# Patient Record
Sex: Male | Born: 1970
Health system: Southern US, Community
[De-identification: ages and names within clinical notes are randomized; demographics above are authoritative.]

## PROBLEM LIST (undated history)

## (undated) DIAGNOSIS — E785 Hyperlipidemia, unspecified: Secondary | ICD-10-CM

## (undated) DIAGNOSIS — I10 Essential (primary) hypertension: Secondary | ICD-10-CM

## (undated) HISTORY — PX: APPENDECTOMY: SHX54

## (undated) HISTORY — DX: Essential (primary) hypertension: I10

## (undated) HISTORY — DX: Hyperlipidemia, unspecified: E78.5

---

## 2000-10-04 ENCOUNTER — Inpatient Hospital Stay (HOSPITAL_COMMUNITY): Admission: EM | Admit: 2000-10-04 | Discharge: 2000-10-05 | Payer: Self-pay | Admitting: Emergency Medicine

## 2004-06-18 ENCOUNTER — Ambulatory Visit: Payer: Self-pay | Admitting: Pulmonary Disease

## 2010-07-21 ENCOUNTER — Telehealth: Payer: Self-pay | Admitting: Pulmonary Disease

## 2010-08-13 NOTE — Progress Notes (Signed)
Summary: Immunizations record request  Phone Note Call from Patient Call back at [phone]   Caller: Patient Call For: Kriste Basque Summary of Call: Pt requesting all immunization. Chart ordered . Zackery Barefoot CMA  July 21, 2010 10:58 AM   Follow-up for Phone Call        [facility] x 1. Per chart pt last seen 2005 and we do not show any documentation. We give PNA, Flu, and Tdap injection  Pt returned call from triage nurse. call [phone]. Tivis Ringer, CNA  July 21, 2010 4:18 PM  Additional Follow-up for Phone Call Additional follow up Details #1::        I advised pt of above. Pt states he will check with his HS for documentation of same. Is currently being followed by Dr. Izola Price in [place] "closer to home". 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 Zackery Barefoot CMA  July 21, 2010 4:24 PM

## 2011-10-05 ENCOUNTER — Encounter: Payer: Self-pay | Admitting: *Deleted

## 2011-10-05 ENCOUNTER — Encounter: Payer: 59 | Attending: Family Medicine | Admitting: *Deleted

## 2011-10-05 DIAGNOSIS — E785 Hyperlipidemia, unspecified: Secondary | ICD-10-CM | POA: Insufficient documentation

## 2011-10-05 DIAGNOSIS — Z713 Dietary counseling and surveillance: Secondary | ICD-10-CM | POA: Insufficient documentation

## 2011-10-05 DIAGNOSIS — I10 Essential (primary) hypertension: Secondary | ICD-10-CM | POA: Insufficient documentation

## 2011-10-05 NOTE — Patient Instructions (Signed)
Daily Goals:  1) 1500 mg or less of sodium  2) 200 mg or less of cholesterol 3) 25-30 g of fiber 4) Continue to limit alcohol, high fat foods, and concentrated sweets. 5) Continue current exercise regimen - Aim for 50 min most days.

## 2011-10-05 NOTE — Progress Notes (Signed)
Medical Nutrition Therapy:  Appt start time: 1000   End time:  1045.  Assessment:  Primary concerns today: Hyperlipidemia, HTN.  Pt here through [facility] program for HTN and high cholesterol.  Reports TC of 245 and increased TG.  Pt's BMI is 26 kg/m^2 and he appears fit and muscular. Issues are likely genetic as he has been on meds since age 41. Dietary intake WNL and pt does not consume sweets, sodas, etc.  ETOH intake is elevated; has 2 gl of wine on 2 days/week (recently decreased from 4 days) and ~6 beers over the weekend.  States he is continuing to decrease intake. Reports some meal skipping, usually breakfast.   MEDICATIONS: See medication list; reconciled with patient.   DIETARY INTAKE:  Usual eating pattern includes 2-3 meals and 0-1 snacks per day.  24-hr recall:  B ( AM): [place] sausage (1 pc), coffee; splenda & skim milk  Snk ( AM): none  L ( PM): [place] burger or grilled chicken salad at hospital Snk ( PM): None D ( PM): not reported Snk ( PM): not reported Beverages: Usually 2 glasses wine x4 days/week; 6 beers over weekend - has recently decreased to 2 glasses on weekend only + beers   Usual physical activity: Gym 4 days/week - 30-50 min cardio  Estimated energy needs: 1800 calories 225 g carbohydrates 110-115 g protein 50 g fat (<15 g as saturated fat)  Other Daily Recommendations: Sodium: 2000 mg or less Cholesterol: 200 mg or less Fiber: 25-30 g  Progress Towards Goal(s):  In progress.   Nutritional Diagnosis:  [place]-2.2 Altered nutrition-related laboratory related to elevated TG and cholesterol as evidenced by patient-reported family history of high cholesterol and a total cholesterol of 245 mg.    Intervention:  Nutrition education/reinforcement (see patient instructions).  Handouts given during visit include:  HTN/Chol Class handouts/ppt  [employer] Guide  Monitoring/Evaluation:  Dietary intake, exercise, labs (as available), and body  weight in 4 week(s).

## 2011-10-28 ENCOUNTER — Ambulatory Visit: Payer: 59 | Admitting: *Deleted

## 2011-11-10 ENCOUNTER — Ambulatory Visit: Payer: 59 | Admitting: *Deleted

## 2012-05-30 ENCOUNTER — Ambulatory Visit (HOSPITAL_COMMUNITY)
Admission: RE | Admit: 2012-05-30 | Discharge: 2012-05-30 | Disposition: A | Discharge status: Home / Self Care | Payer: 59 | Source: Ambulatory Visit | Attending: Emergency Medicine | Admitting: Emergency Medicine

## 2012-05-30 ENCOUNTER — Other Ambulatory Visit (HOSPITAL_COMMUNITY): Payer: Self-pay | Admitting: Emergency Medicine

## 2012-05-30 DIAGNOSIS — R05 Cough: Secondary | ICD-10-CM

## 2012-05-30 DIAGNOSIS — R059 Cough, unspecified: Secondary | ICD-10-CM

## 2012-08-26 ENCOUNTER — Other Ambulatory Visit: Payer: Self-pay

## 2013-05-17 ENCOUNTER — Other Ambulatory Visit: Payer: Self-pay

## 2013-11-24 ENCOUNTER — Emergency Department (HOSPITAL_COMMUNITY)
Admission: EM | Admit: 2013-11-24 | Discharge: 2013-11-25 | Disposition: A | Discharge status: Home / Self Care | Payer: 59 | Attending: Emergency Medicine | Admitting: Emergency Medicine

## 2013-11-24 ENCOUNTER — Encounter (HOSPITAL_COMMUNITY): Payer: Self-pay | Admitting: Emergency Medicine

## 2013-11-24 DIAGNOSIS — Z8639 Personal history of other endocrine, nutritional and metabolic disease: Secondary | ICD-10-CM | POA: Insufficient documentation

## 2013-11-24 DIAGNOSIS — J159 Unspecified bacterial pneumonia: Secondary | ICD-10-CM | POA: Insufficient documentation

## 2013-11-24 DIAGNOSIS — Z862 Personal history of diseases of the blood and blood-forming organs and certain disorders involving the immune mechanism: Secondary | ICD-10-CM | POA: Insufficient documentation

## 2013-11-24 DIAGNOSIS — Z79899 Other long term (current) drug therapy: Secondary | ICD-10-CM | POA: Insufficient documentation

## 2013-11-24 DIAGNOSIS — R0781 Pleurodynia: Secondary | ICD-10-CM

## 2013-11-24 DIAGNOSIS — J189 Pneumonia, unspecified organism: Secondary | ICD-10-CM

## 2013-11-24 DIAGNOSIS — I10 Essential (primary) hypertension: Secondary | ICD-10-CM | POA: Insufficient documentation

## 2013-11-24 DIAGNOSIS — R071 Chest pain on breathing: Secondary | ICD-10-CM | POA: Insufficient documentation

## 2013-11-24 NOTE — ED Notes (Signed)
Patient transported to XR. 

## 2013-11-24 NOTE — ED Provider Notes (Signed)
CSN: [id number]     Arrival date & time 11/24/13  2235 History   First MD Initiated Contact with Patient 11/24/13 2343     Chief Complaint  Patient presents with  . Chest Pain  . Back Pain  . Diarrhea     (Consider location/radiation/quality/duration/timing/severity/associated sxs/prior Treatment) HPI Patient is a 43 year old man with well-controlled hypertension. He presents with complaints of pleuritic pain which localizes to a specific region of the lateral left chest. Symptoms began about 48 hours ago. Symptoms are worse with certain movements of the torso and deep inspiration. The pain is sharp, mild to moderate in severity. The patient denies any associated shortness of breath, cough, fever. No history of trauma or strain.   No PE RF. No history of VTE. No history of similar sx. Pain is nonradiating.    Past Medical History  Diagnosis Date  . Hyperlipidemia   . Hypertension    Past Surgical History  Procedure Laterality Date  . Appendectomy     Family History  Problem Relation Age of Onset  . Diabetes Maternal Grandmother    History  Substance Use Topics  . Smoking status: Never Smoker   . Smokeless tobacco: Not on file  . Alcohol Use: Yes     Comment: 2 gl of wine x2 days/week + 5-6 beers over weekend    Review of Systems Ten point review of symptoms performed and is negative with the exception of symptoms noted above and endorsement of lose stools for the past month.     Allergies  Review of patient's allergies indicates no known allergies.  Home Medications   Prior to Admission medications   Medication Sig Start Date End Date Taking? Authorizing Provider  amLODipine-benazepril (LOTREL) 5-10 MG per capsule Take 1 capsule by mouth daily.    Historical Provider, MD  bisoprolol-hydrochlorothiazide [facility](ZIAC) 2.5-6.25 MG per tablet Take 1 tablet by mouth daily.    Historical Provider, MD  fish oil-omega-3 fatty acids 1000 MG capsule Take 2 g by mouth 2 (two) times  daily.    Historical Provider, MD   BP 168/86  Pulse 48  Temp(Src) 98.7 F (37.1 C) (Oral)  Resp 18  Ht 5\' 7"  (1.702 m)  Wt 174 lb (78.926 kg)  BMI 27.25 kg/m2  SpO2 98% Physical Exam Gen: well developed and well nourished appearing Head: NCAT Eyes: PERL, EOMI Nose: no epistaixis or rhinorrhea Mouth/throat: mucosa is moist and pink Neck: supple, no stridor Lungs: CTA B, no wheezing, rhonchi or rales Chest wall: no ttp CV: bradycardic in upper 40s and regular, no murmur, extremities appear well perfused.  Abd: soft, notender, nondistended Back: no ttp, no cva ttp Skin: warm and dry Ext: normal to inspection, no dependent edema Neuro: CN ii-xii grossly intact, no focal deficits Psyche; normal affect,  calm and cooperative.  ED Course  Procedures (including critical care time) Labs Review Results for orders placed during the hospital encounter of 11/24/13 (from the past 24 hour(s))  CBC     Status: None   Collection Time    11/24/13 11:45 PM      Result Value Ref Range   WBC 5.2  4.0 - 10.5 K/uL   RBC 4.63  4.22 - 5.81 MIL/uL   Hemoglobin 14.4  13.0 - 17.0 g/dL   HCT 40.940.0  81.139.0 - 91.452.0 %   MCV 86.4  78.0 - 100.0 fL   MCH 31.1  26.0 - 34.0 pg   MCHC 36.0  30.0 - 36.0 g/dL  RDW 12.0  11.5 - 15.5 %   Platelets 176  150 - 400 K/uL  COMPREHENSIVE METABOLIC PANEL     Status: Abnormal   Collection Time    11/24/13 11:45 PM      Result Value Ref Range   Sodium 138  137 - 147 mEq/L   Potassium 3.9  3.7 - 5.3 mEq/L   Chloride 102  96 - 112 mEq/L   CO2 23  19 - 32 mEq/L   Glucose, Bld 101 (*) 70 - 99 mg/dL   BUN 16  6 - 23 mg/dL   Creatinine, Ser 4.091.07  0.50 - 1.35 mg/dL   Calcium 9.7  8.4 - 81.110.5 mg/dL   Total Protein 7.7  6.0 - 8.3 g/dL   Albumin 4.3  3.5 - 5.2 g/dL   AST 23  0 - 37 U/L   ALT 19  0 - 53 U/L   Alkaline Phosphatase 40  39 - 117 U/L   Total Bilirubin 0.4  0.3 - 1.2 mg/dL   GFR calc non Af Amer 85 (*) >90 mL/min   GFR calc Af Amer >90  >90 mL/min  I-STAT  TROPOININ, ED     Status: None   Collection Time    11/25/13 12:00 AM      Result Value Ref Range   Troponin i, poc 0.00  0.00 - 0.08 ng/mL   Comment 3                DG Chest 2 View (Final result)  Result time: 11/25/13 00:30:37    Final result by Rad Results In Interface (11/25/13 00:30:37)    Narrative:   CLINICAL DATA: Mid back chest pain, history of diarrhea  EXAM: CHEST 2 VIEW  COMPARISON: None.  FINDINGS: The lungs are borderline hypoinflated. There are new coarse lung markings in the infrahilar region on the left posteriorly in the lower lobe consistent with pneumonia. The cardiopericardial silhouette is top-normal in size. The pulmonary vascularity is mildly prominent centrally. The trachea is midline. There is no pleural effusion or pneumothorax. The observed portions of the bony thorax appear normal.  IMPRESSION: Coarse lung markings in the left lower lobe are consistent with atelectasis or pneumonia. There is prominence of the cardiac silhouette and central pulmonary vascularity which is accentuated by borderline hypo- inflation. There is no definite evidence of pulmonary edema.   Electronically Signed By: David SwazilandJordan On: 11/25/2013 00:30    EKG: nsr, no acute ischemic changes, normal intervals, normal axis, normal qrs complex x for incomplete RBBB  MDM   DDX: pna, ptx, pe, acs, pleural effusion, myofascial pain.   Patient can be ruled out using PERC criteria. CXR concerning for possible LLL pneumonia v. Atelectasis. Although, the patient does not have symptoms or clinical findings to support pneumonia, his pain is concerning and myay represent early pna. Thus, will tx with Azithromycin for CAP. Patient to f/u with his PCP. Counseled re: results, plan, need for follow up and return preacautions.     Brandt LoosenJulie Breannah Kratt, MD 11/25/13 [id number]

## 2013-11-24 NOTE — ED Notes (Signed)
Pt c/o intermittent left back pain radiating to left axillary starting last weekend. Pt states pain increases when lying down, movement and deep inspiration. Pt denies any recent long distant travel. Onset of back pain when pt woke up. Pt denies any recent heavy lifting or moving. Pt also reports diarrhea for one month. Describes diarrhea as clear with mucus.

## 2013-11-25 ENCOUNTER — Emergency Department (HOSPITAL_COMMUNITY): Payer: 59

## 2013-11-25 LAB — COMPREHENSIVE METABOLIC PANEL
ALT: 19 U/L (ref 0–53)
AST: 23 U/L (ref 0–37)
Albumin: 4.3 g/dL (ref 3.5–5.2)
Alkaline Phosphatase: 40 U/L (ref 39–117)
BUN: 16 mg/dL (ref 6–23)
CALCIUM: 9.7 mg/dL (ref 8.4–10.5)
CO2: 23 mEq/L (ref 19–32)
Chloride: 102 mEq/L (ref 96–112)
Creatinine, Ser: 1.07 mg/dL (ref 0.50–1.35)
GFR calc Af Amer: >90 mL/min (ref >90)
GFR calc non Af Amer: 85 mL/min — ABNORMAL LOW (ref >90)
Glucose, Bld: 101 mg/dL — ABNORMAL HIGH (ref 70–99)
Potassium: 3.9 mEq/L (ref 3.7–5.3)
SODIUM: 138 meq/L (ref 137–147)
TOTAL PROTEIN: 7.7 g/dL (ref 6.0–8.3)
Total Bilirubin: 0.4 mg/dL (ref 0.3–1.2)

## 2013-11-25 LAB — I-STAT TROPONIN, ED: Troponin i, poc: 0 ng/mL (ref 0.00–0.08)

## 2013-11-25 LAB — CBC
HCT: 40 % (ref 39.0–52.0)
Hemoglobin: 14.4 g/dL (ref 13.0–17.0)
MCH: 31.1 pg (ref 26.0–34.0)
MCHC: 36 g/dL (ref 30.0–36.0)
MCV: 86.4 fL (ref 78.0–100.0)
Platelets: 176 10*3/uL (ref 150–400)
RBC: 4.63 MIL/uL (ref 4.22–5.81)
RDW: 12 % (ref 11.5–15.5)
WBC: 5.2 10*3/uL (ref 4.0–10.5)

## 2013-11-25 MED ORDER — TRAMADOL HCL 50 MG PO TABS: 50.0000 mg | ORAL_TABLET | Freq: Four times a day (QID) | ORAL | Status: DC | PRN

## 2013-11-25 MED ORDER — AZITHROMYCIN 250 MG PO TABS: 250.0000 mg | ORAL_TABLET | Freq: Every day | ORAL | Status: DC

## 2013-11-25 MED ORDER — TRAMADOL HCL 50 MG PO TABS
50.0000 mg | ORAL_TABLET | Freq: Once | ORAL | Status: AC
  Administered 2013-11-25: 50 mg via ORAL
  Filled 2013-11-25: qty 1

## 2013-11-25 MED ORDER — NAPROXEN 375 MG PO TABS: 375.0000 mg | ORAL_TABLET | Freq: Two times a day (BID) | ORAL | Status: DC

## 2013-11-25 NOTE — ED Notes (Signed)
Pt pain a 7/10. mischarted 3/10. MD notified. Orders obtained

## 2013-11-25 NOTE — ED Notes (Signed)
Patient taken to XR.

## 2014-12-25 ENCOUNTER — Telehealth: Payer: Self-pay | Admitting: Family

## 2014-12-25 DIAGNOSIS — J019 Acute sinusitis, unspecified: Secondary | ICD-10-CM

## 2014-12-25 MED ORDER — AMOXICILLIN-POT CLAVULANATE 875-125 MG PO TABS: 1.0000 | ORAL_TABLET | Freq: Two times a day (BID) | ORAL | Status: DC

## 2014-12-25 NOTE — Progress Notes (Signed)

## 2015-09-02 DIAGNOSIS — H7411 Adhesive right middle ear disease: Secondary | ICD-10-CM | POA: Diagnosis not present

## 2015-09-02 DIAGNOSIS — H9011 Conductive hearing loss, unilateral, right ear, with unrestricted hearing on the contralateral side: Secondary | ICD-10-CM | POA: Diagnosis not present

## 2015-09-18 MED FILL — BISOPROLOL-HCTZ 10-6.25 MG: 10-6.25 | 90 days supply | Qty: 90 | Fill #0

## 2015-09-18 MED FILL — AMLODIPINE-BENAZEPRIL 10-20: 10-20 | 90 days supply | Qty: 90 | Fill #0

## 2015-12-18 MED FILL — AMLODIPINE-BENAZEPRIL 10-20: 10-20 | 90 days supply | Qty: 90 | Fill #0

## 2015-12-18 MED FILL — BISOPROLOL-HCTZ 10-6.25 MG: 10-6.25 | 90 days supply | Qty: 90 | Fill #0

## 2016-01-05 DIAGNOSIS — E7800 Pure hypercholesterolemia, unspecified: Secondary | ICD-10-CM | POA: Diagnosis not present

## 2016-01-05 DIAGNOSIS — I10 Essential (primary) hypertension: Secondary | ICD-10-CM | POA: Diagnosis not present

## 2016-03-19 MED FILL — BISOPROLOL-HCTZ 10-6.25 MG: 10-6.25 | 90 days supply | Qty: 90 | Fill #0

## 2016-03-19 MED FILL — AMLODIPINE-BENAZEPRIL 10-20: 10-20 | 90 days supply | Qty: 90 | Fill #0

## 2016-05-13 DIAGNOSIS — H5213 Myopia, bilateral: Secondary | ICD-10-CM | POA: Diagnosis not present

## 2016-06-16 MED FILL — AMLODIPINE-BENAZEPRIL 10-20: 10-20 | 90 days supply | Qty: 90 | Fill #1

## 2016-06-16 MED FILL — BISOPROLOL-HCTZ 10-6.25 MG: 10-6.25 | 90 days supply | Qty: 90 | Fill #1

## 2016-08-11 DIAGNOSIS — E7800 Pure hypercholesterolemia, unspecified: Secondary | ICD-10-CM | POA: Diagnosis not present

## 2016-08-11 DIAGNOSIS — I10 Essential (primary) hypertension: Secondary | ICD-10-CM | POA: Diagnosis not present

## 2016-08-18 DIAGNOSIS — H6041 Cholesteatoma of right external ear: Secondary | ICD-10-CM | POA: Diagnosis not present

## 2016-08-18 DIAGNOSIS — H73091 Other acute myringitis, right ear: Secondary | ICD-10-CM | POA: Diagnosis not present

## 2016-08-18 DIAGNOSIS — H9011 Conductive hearing loss, unilateral, right ear, with unrestricted hearing on the contralateral side: Secondary | ICD-10-CM | POA: Diagnosis not present

## 2016-08-18 DIAGNOSIS — H7411 Adhesive right middle ear disease: Secondary | ICD-10-CM | POA: Diagnosis not present

## 2016-09-03 MED FILL — LOVASTATIN 20 MG TABLET: 20 | 90 days supply | Qty: 90 | Fill #0

## 2016-09-13 DIAGNOSIS — H04331 Acute lacrimal canaliculitis of right lacrimal passage: Secondary | ICD-10-CM | POA: Diagnosis not present

## 2016-09-14 MED FILL — BISOPROLOL-HCTZ 10-6.25 MG: 10-6.25 | 90 days supply | Qty: 90 | Fill #0

## 2016-09-14 MED FILL — AMLODIPINE-BENAZEPRIL 10-20: 10-20 | 90 days supply | Qty: 90 | Fill #0

## 2016-10-21 DIAGNOSIS — H9011 Conductive hearing loss, unilateral, right ear, with unrestricted hearing on the contralateral side: Secondary | ICD-10-CM | POA: Diagnosis not present

## 2016-10-21 DIAGNOSIS — H7411 Adhesive right middle ear disease: Secondary | ICD-10-CM | POA: Diagnosis not present

## 2016-12-16 MED FILL — AMLODIPINE-BENAZEPRIL 10-20: 10-20 | 90 days supply | Qty: 90 | Fill #1

## 2016-12-16 MED FILL — BISOPROLOL-HCTZ 10-6.25 MG: 10-6.25 | 90 days supply | Qty: 90 | Fill #1

## 2017-02-09 DIAGNOSIS — H9011 Conductive hearing loss, unilateral, right ear, with unrestricted hearing on the contralateral side: Secondary | ICD-10-CM | POA: Diagnosis not present

## 2017-02-09 DIAGNOSIS — H6981 Other specified disorders of Eustachian tube, right ear: Secondary | ICD-10-CM | POA: Diagnosis not present

## 2017-02-09 DIAGNOSIS — H7411 Adhesive right middle ear disease: Secondary | ICD-10-CM | POA: Diagnosis not present

## 2017-03-15 MED FILL — BISOPROLOL-HCTZ 10-6.25 MG: 10-6.25 | 90 days supply | Qty: 90 | Fill #0

## 2017-03-15 MED FILL — AMLODIPINE-BENAZEPRIL 10-20: 10-20 | 90 days supply | Qty: 90 | Fill #0

## 2017-05-28 ENCOUNTER — Telehealth: Payer: 59 | Admitting: Family

## 2017-05-28 DIAGNOSIS — J029 Acute pharyngitis, unspecified: Secondary | ICD-10-CM

## 2017-05-28 MED ORDER — FLUTICASONE PROPIONATE 50 MCG/ACT NA SUSP: 2.0000 | Freq: Every day | NASAL | 6 refills | Status: DC

## 2017-05-28 MED ORDER — PREDNISONE 5 MG PO TABS: 5.0000 mg | ORAL_TABLET | ORAL | 0 refills | Status: DC

## 2017-05-28 MED ORDER — BENZONATATE 100 MG PO CAPS: 100.0000 mg | ORAL_CAPSULE | Freq: Three times a day (TID) | ORAL | 0 refills | Status: DC | PRN

## 2017-05-28 NOTE — Progress Notes (Signed)
Thank you for the details you included in the comment boxes. Those details are very helpful in determining the best course of treatment for you and help us to provide the best care. You will see the treatment plan covers cough also. I am sending flonase prescription in addition to the treatment below. This is likely a virus at this point in time as 89% of these infections are viral.   We are sorry that you are not feeling well.  Here is how we plan to help!  Based on your presentation I believe you most likely have A cough due to a virus.  This is called viral bronchitis and is best treated by rest, plenty of fluids and control of the cough.  You may use Ibuprofen or Tylenol as directed to help your symptoms.     In addition you may use A non-prescription cough medication called Mucinex DM: take 2 tablets every 12 hours. and A prescription cough medication called Tessalon Perles 100mg . You may take 1-2 capsules every 8 hours as needed for your cough.  Sterapred 5 mg dosepak  From your responses in the eVisit questionnaire you describe inflammation in the upper respiratory tract which is causing a significant cough.  This is commonly called Bronchitis and has four common causes:    Allergies  Viral Infections  Acid Reflux  Bacterial Infection Allergies, viruses and acid reflux are treated by controlling symptoms or eliminating the cause. An example might be a cough caused by taking certain blood pressure medications. You stop the cough by changing the medication. Another example might be a cough caused by acid reflux. Controlling the reflux helps control the cough.  USE OF BRONCHODILATOR ("RESCUE") INHALERS: There is a risk from using your bronchodilator too frequently.  The risk is that over-reliance on a medication which only relaxes the muscles surrounding the breathing tubes can reduce the effectiveness of medications prescribed to reduce swelling and congestion of the tubes themselves.   Although you feel brief relief from the bronchodilator inhaler, your asthma may actually be worsening with the tubes becoming more swollen and filled with mucus.  This can delay other crucial treatments, such as oral steroid medications. If you need to use a bronchodilator inhaler daily, several times per day, you should discuss this with your provider.  There are probably better treatments that could be used to keep your asthma under control.     HOME CARE . Only take medications as instructed by your medical team. . Complete the entire course of an antibiotic. . Drink plenty of fluids and get plenty of rest. . Avoid close contacts especially the very young and the elderly . Cover your mouth if you cough or cough into your sleeve. . Always remember to wash your hands . A steam or ultrasonic humidifier can help congestion.   GET HELP RIGHT AWAY IF: . You develop worsening fever. . You become short of breath . You cough up blood. . Your symptoms persist after you have completed your treatment plan MAKE SURE YOU   Understand these instructions.  Will watch your condition.  Will get help right away if you are not doing well or get worse.  Your e-visit answers were reviewed by a board certified advanced clinical practitioner to complete your personal care plan.  Depending on the condition, your plan could have included both over the counter or prescription medications. If there is a problem please reply  once you have received a response from your provider. Your  safety is important to us.  If you have drug allergies check your prescription carefully.    You can use MyChart to ask questions about today's visit, request a non-urgent call back, or ask for a work or school excuse for 24 hours related to this e-Visit. If it has been greater than 24 hours you will need to follow up with your provider, or enter a new e-Visit to address those concerns. You will get an e-mail in the next two days  asking about your experience.  I hope that your e-visit has been valuable and will speed your recovery. Thank you for using e-visits.

## 2017-05-30 MED FILL — FLUTICASONE PROP 50 MCG SPR: 50 | 30 days supply | Qty: 16 | Fill #0

## 2017-05-30 MED FILL — BENZONATATE 100 MG CAPS: 100 | 5 days supply | Qty: 30 | Fill #0

## 2017-05-30 MED FILL — predniSONE 5 MG TABS: 5 | 6 days supply | Qty: 21 | Fill #0

## 2017-06-22 MED FILL — AMLODIPINE-BENAZEPRIL 10-20: 10-20 | 90 days supply | Qty: 90 | Fill #0

## 2017-06-23 MED FILL — BISOPROLOL-HCTZ 10-6.25 MG: 10-6.25 | 90 days supply | Qty: 90 | Fill #0

## 2017-09-05 ENCOUNTER — Telehealth: Payer: 59 | Admitting: Family

## 2017-09-05 DIAGNOSIS — R6889 Other general symptoms and signs: Secondary | ICD-10-CM

## 2017-09-05 MED ORDER — OSELTAMIVIR PHOSPHATE 75 MG PO CAPS: 75.0000 mg | ORAL_CAPSULE | Freq: Two times a day (BID) | ORAL | 0 refills | Status: DC

## 2017-09-05 MED FILL — OSELTAMIVIR PHOSPHATE 75 MG: 75 | 5 days supply | Qty: 10 | Fill #0

## 2017-09-05 NOTE — Progress Notes (Signed)
Thank you for the details you included in the comment boxes. Those details are very helpful in determining the best course of treatment for you and help [place] to provide the best care. As you work in [employer], your chance of exposure to flu is higher than the general population. Therefore, I will prescribe the Tamiflu with the instruction that if you worsen (body aches, malaise, high fever, etc.) within the next 24h, you can take the Tamiflu. At this point, this may just be a regular virus. See below.  E visit for Flu like symptoms   We are sorry that you are not feeling well.  Here is how we plan to help! Based on what you have shared with me it looks like you may have possible exposure to a virus that causes influenza.  Influenza or "the flu" is   an infection caused by a respiratory virus. The flu virus is highly contagious and persons who did not receive their yearly flu vaccination may "catch" the flu from close contact.  We have anti-viral medications to treat the viruses that cause this infection. They are not a "cure" and only shorten the course of the infection. These prescriptions are most effective when they are given within the first 2 days of "flu" symptoms. Antiviral medication are indicated if you have a high risk of complications from the flu. You should  also consider an antiviral medication if you are in close contact with someone who is at risk. These medications can help patients avoid complications from the flu  but have side effects that you should know. Possible side effects from Tamiflu or oseltamivir include nausea, vomiting, diarrhea, dizziness, headaches, eye redness, sleep problems or other respiratory symptoms. You should not take Tamiflu if you have an allergy to oseltamivir or any to the ingredients in Tamiflu.  Based upon your symptoms and potential risk factors I have prescribed Oseltamivir (Tamiflu).  It has been sent to your designated pharmacy.  You will take one 75 mg  capsule orally twice a day for the next 5 days.  ANYONE WHO HAS FLU SYMPTOMS SHOULD: . Stay home. The flu is highly contagious and going out or to work exposes others! . Be sure to drink plenty of fluids. Water is fine as well as fruit juices, sodas and electrolyte beverages. You may want to stay away from caffeine or alcohol. If you are nauseated, try taking small sips of liquids. How do you know if you are getting enough fluid? Your urine should be a pale yellow or almost colorless. . Get rest. . Taking a steamy shower or using a humidifier may help nasal congestion and ease sore throat pain. Using a saline nasal spray works much the same way. . Cough drops, hard candies and sore throat lozenges may ease your cough. . Line up a caregiver. Have someone check on you regularly.   GET HELP RIGHT AWAY IF: . You cannot keep down liquids or your medications. . You become short of breath . Your fell like you are going to pass out or loose consciousness. . Your symptoms persist after you have completed your treatment plan MAKE SURE YOU   Understand these instructions.  Will watch your condition.  Will get help right away if you are not doing well or get worse.  Your e-visit answers were reviewed by a board certified advanced clinical practitioner to complete your personal care plan.  Depending on the condition, your plan could have included both over the counter  or prescription medications.  If there is a problem please reply  once you have received a response from your provider.  Your safety is important to us.  If you have drug allergies check your prescription carefully.    You can use MyChart to ask questions about today's visit, request a non-urgent call back, or ask for a work or school excuse for 24 hours related to this e-Visit. If it has been greater than 24 hours you will need to follow up with your provider, or enter a new e-Visit to address those concerns.  You will get an e-mail  in the next two days asking about your experience.  I hope that your e-visit has been valuable and will speed your recovery. Thank you for using e-visits.

## 2017-09-19 MED FILL — BISOPROLOL-HCTZ 10-6.25 MG: 10-6.25 | 90 days supply | Qty: 90 | Fill #0

## 2017-09-19 MED FILL — AMLODIPINE-BENAZEPRIL 10-20: 10-20 | 90 days supply | Qty: 90 | Fill #0

## 2017-10-04 DIAGNOSIS — H6981 Other specified disorders of Eustachian tube, right ear: Secondary | ICD-10-CM | POA: Diagnosis not present

## 2017-10-04 DIAGNOSIS — H7411 Adhesive right middle ear disease: Secondary | ICD-10-CM | POA: Diagnosis not present

## 2017-10-04 DIAGNOSIS — H6041 Cholesteatoma of right external ear: Secondary | ICD-10-CM | POA: Diagnosis not present

## 2017-10-04 DIAGNOSIS — H9011 Conductive hearing loss, unilateral, right ear, with unrestricted hearing on the contralateral side: Secondary | ICD-10-CM | POA: Diagnosis not present

## 2017-11-16 DIAGNOSIS — H5213 Myopia, bilateral: Secondary | ICD-10-CM | POA: Diagnosis not present

## 2017-11-25 DIAGNOSIS — E7800 Pure hypercholesterolemia, unspecified: Secondary | ICD-10-CM | POA: Diagnosis not present

## 2017-11-25 DIAGNOSIS — Z23 Encounter for immunization: Secondary | ICD-10-CM | POA: Diagnosis not present

## 2017-11-25 DIAGNOSIS — Z0000 Encounter for general adult medical examination without abnormal findings: Secondary | ICD-10-CM | POA: Diagnosis not present

## 2017-11-25 DIAGNOSIS — I10 Essential (primary) hypertension: Secondary | ICD-10-CM | POA: Diagnosis not present

## 2017-12-19 MED FILL — AMLODIPINE-BENAZEPRIL 10-20: 10-20 | 90 days supply | Qty: 90 | Fill #0

## 2017-12-19 MED FILL — BISOPROLOL-HCTZ 10-6.25 MG: 10-6.25 | 90 days supply | Qty: 90 | Fill #0

## 2017-12-19 MED FILL — LOVASTATIN 20 MG TABS: 20 | 90 days supply | Qty: 90 | Fill #0

## 2018-03-23 MED FILL — AMLODIPINE-BENAZEPRIL 10-20: 10-20 | 90 days supply | Qty: 90 | Fill #1

## 2018-03-23 MED FILL — BISOPROLOL-HCTZ 10-6.25 MG: 10-6.25 | 90 days supply | Qty: 90 | Fill #1

## 2018-06-26 MED FILL — BISOPROLOL-HCTZ 10-6.25 MG: 10-6.25 | 90 days supply | Qty: 90 | Fill #0

## 2018-06-26 MED FILL — AMLODIPINE-BENAZEPRIL 10-20: 10-20 | 90 days supply | Qty: 90 | Fill #0

## 2018-09-13 MED FILL — BISOPROLOL-HCTZ 10-6.25 MG: 10-6.25 | 90 days supply | Qty: 90 | Fill #0

## 2018-09-15 MED FILL — AMLODIPINE-BENAZEPRIL 10-20: 10-20 | 90 days supply | Qty: 90 | Fill #0

## 2018-09-26 DIAGNOSIS — H9011 Conductive hearing loss, unilateral, right ear, with unrestricted hearing on the contralateral side: Secondary | ICD-10-CM | POA: Diagnosis not present

## 2018-09-26 DIAGNOSIS — H6041 Cholesteatoma of right external ear: Secondary | ICD-10-CM | POA: Diagnosis not present

## 2018-09-26 DIAGNOSIS — H7411 Adhesive right middle ear disease: Secondary | ICD-10-CM | POA: Diagnosis not present

## 2018-09-26 DIAGNOSIS — H6981 Other specified disorders of Eustachian tube, right ear: Secondary | ICD-10-CM | POA: Diagnosis not present

## 2018-09-26 DIAGNOSIS — H7311 Chronic myringitis, right ear: Secondary | ICD-10-CM | POA: Diagnosis not present

## 2018-10-10 DIAGNOSIS — R361 Hematospermia: Secondary | ICD-10-CM | POA: Diagnosis not present

## 2018-10-10 DIAGNOSIS — Z87438 Personal history of other diseases of male genital organs: Secondary | ICD-10-CM | POA: Diagnosis not present

## 2018-10-10 MED FILL — SULFAMETHOXAZOLE-TMP DS TAB: 800-160 | 21 days supply | Qty: 42 | Fill #0

## 2018-11-15 ENCOUNTER — Telehealth: Payer: 59 | Admitting: Nurse Practitioner

## 2018-11-15 DIAGNOSIS — H1010 Acute atopic conjunctivitis, unspecified eye: Secondary | ICD-10-CM

## 2018-11-15 NOTE — Progress Notes (Signed)
We are sorry that you are not feeling well.  Here is how we plan to help!  Based on what you have shared with me it looks like you have conjunctivitis.  Conjunctivitis is a common inflammatory or infectious condition of the eye that is often referred to as "pink eye".  In most cases it is contagious (viral or bacterial). However, not all conjunctivitis requires antibiotics (ex. Allergic).  We have made appropriate suggestions for you based upon your presentation.  I recommend that you use OpconA, 1-2 drops every 4-6 hours (an over the counter allergy drop available at your local pharmacy).  Your pharmacist may have an alternative suggestion.  Pink eye can be highly contagious.  It is typically spread through direct contact with secretions, or contaminated objects or surfaces that one may have touched.  Strict handwashing is suggested with soap and water is urged.  If not available, use alcohol based had sanitizer.  Avoid unnecessary touching of the eye.  If you wear contact lenses, you will need to refrain from wearing them until you see no white discharge from the eye for at least 24 hours after being on medication.  You should see symptom improvement in 1-2 days after starting the medication regimen.  Call us if symptoms are not improved in 1-2 days.  Home Care:  Wash your hands often!  Do not wear your contacts until you complete your treatment plan.  Avoid sharing towels, bed linen, personal items with a person who has pink eye.  See attention for anyone in your home with similar symptoms.  Get Help Right Away If:  Your symptoms do not improve.  You develop blurred or loss of vision.  Your symptoms worsen (increased discharge, pain or redness)  Your e-visit answers were reviewed by a board certified advanced clinical practitioner to complete your personal care plan.  Depending on the condition, your plan could have included both over the counter or prescription medications.  If there  is a problem please reply  once you have received a response from your provider.  Your safety is important to [place].  If you have drug allergies check your prescription carefully.    You can use MyChart to ask questions about today's visit, request a non-urgent call back, or ask for a work or school excuse for 24 hours related to this e-Visit. If it has been greater than 24 hours you will need to follow up with your provider, or enter a new e-Visit to address those concerns.   You will get an e-mail in the next two days asking about your experience.  I hope that your e-visit has been valuable and will speed your recovery. Thank you for using e-visits.   5-10 minutes spent reviewing and documenting in chart.

## 2018-12-13 MED FILL — BISOPROLOL-HCTZ 10-6.25 MG: 10-6.25 | 90 days supply | Qty: 90 | Fill #0

## 2018-12-13 MED FILL — AMLODIPINE-BENAZEPRIL 10-20: 10-20 | 90 days supply | Qty: 90 | Fill #0

## 2019-02-14 MED FILL — AMLODIPINE-BENAZ 10-40 MG: 10-40 | 30 days supply | Qty: 30 | Fill #0

## 2019-03-13 ENCOUNTER — Other Ambulatory Visit: Payer: Self-pay

## 2019-03-13 ENCOUNTER — Emergency Department (HOSPITAL_BASED_OUTPATIENT_CLINIC_OR_DEPARTMENT_OTHER)
Admission: EM | Admit: 2019-03-13 | Discharge: 2019-03-13 | Disposition: A | Discharge status: Home / Self Care | Payer: 59 | Attending: Emergency Medicine | Admitting: Emergency Medicine

## 2019-03-13 ENCOUNTER — Encounter (HOSPITAL_BASED_OUTPATIENT_CLINIC_OR_DEPARTMENT_OTHER): Payer: Self-pay | Admitting: Emergency Medicine

## 2019-03-13 ENCOUNTER — Emergency Department (HOSPITAL_BASED_OUTPATIENT_CLINIC_OR_DEPARTMENT_OTHER): Payer: 59

## 2019-03-13 DIAGNOSIS — R0789 Other chest pain: Secondary | ICD-10-CM | POA: Diagnosis not present

## 2019-03-13 DIAGNOSIS — E119 Type 2 diabetes mellitus without complications: Secondary | ICD-10-CM | POA: Diagnosis not present

## 2019-03-13 DIAGNOSIS — R079 Chest pain, unspecified: Secondary | ICD-10-CM | POA: Diagnosis not present

## 2019-03-13 DIAGNOSIS — Z79899 Other long term (current) drug therapy: Secondary | ICD-10-CM | POA: Diagnosis not present

## 2019-03-13 DIAGNOSIS — E785 Hyperlipidemia, unspecified: Secondary | ICD-10-CM | POA: Diagnosis not present

## 2019-03-13 DIAGNOSIS — I10 Essential (primary) hypertension: Secondary | ICD-10-CM | POA: Diagnosis not present

## 2019-03-13 LAB — BASIC METABOLIC PANEL
Anion gap: 11 (ref 5–15)
BUN: 13 mg/dL (ref 6–20)
CO2: 25 mmol/L (ref 22–32)
Calcium: 10.2 mg/dL (ref 8.9–10.3)
Chloride: 100 mmol/L (ref 98–111)
Creatinine, Ser: 1.1 mg/dL (ref 0.61–1.24)
GFR calc Af Amer: >60 mL/min (ref >60)
GFR calc non Af Amer: >60 mL/min (ref >60)
Glucose, Bld: 110 mg/dL — ABNORMAL HIGH (ref 70–99)
Potassium: 3.5 mmol/L (ref 3.5–5.1)
Sodium: 136 mmol/L (ref 135–145)

## 2019-03-13 LAB — CBC
HCT: 42.1 % (ref 39.0–52.0)
Hemoglobin: 14.5 g/dL (ref 13.0–17.0)
MCH: 30.7 pg (ref 26.0–34.0)
MCHC: 34.4 g/dL (ref 30.0–36.0)
MCV: 89 fL (ref 80.0–100.0)
Platelets: 192 10*3/uL (ref 150–400)
RBC: 4.73 MIL/uL (ref 4.22–5.81)
RDW: 12.6 % (ref 11.5–15.5)
WBC: 4.7 10*3/uL (ref 4.0–10.5)
nRBC: 0 % (ref 0.0–0.2)

## 2019-03-13 LAB — TROPONIN I (HIGH SENSITIVITY): Troponin I (High Sensitivity): 5 ng/L (ref <18)

## 2019-03-13 NOTE — ED Notes (Signed)
ED Provider at bedside. 

## 2019-03-13 NOTE — ED Provider Notes (Signed)
[facility] EMERGENCY DEPARTMENT Provider Note   CSN: [id number] Arrival date & time: 03/13/19  1459     History   Chief Complaint Chief Complaint  Patient presents with  . Chest Pain    HPI Alex Jensen. is a 48 y.o. male.     48 yo M with a chief complaint of chest pain.  Going on for about 3 days.  More consistent and worse today.  The patient denies exertional symptoms.  In fact the patient is a regular runner and is run multiple times since he is at the onset of his pain with actual improvement of his symptoms.  Denies shortness of breath with this.  The pain is left lateral.  Is radiating somewhat down his arm.  He denies history of MI.  Denies history of PE or DVT.  Has a history of hypertension hyperlipidemia and diabetes.  Denies smoking or family history of MI.  Denies recent surgery or hospitalization.  Denies hemoptysis.  Denies unilateral upper extremity edema.  Recently went to [place] in the past month drove for 9 hours in 1 stretch.  The history is provided by the patient.  Chest Pain Pain location:  L lateral chest Pain quality: aching   Pain radiates to:  L arm Pain severity:  Moderate Onset quality:  Gradual Duration:  3 days Timing:  Constant Progression:  Worsening Chronicity:  New Relieved by:  Nothing Worsened by:  Nothing Ineffective treatments:  None tried Associated symptoms: no abdominal pain, no fever, no headache, no palpitations, no shortness of breath and no vomiting     Past Medical History:  Diagnosis Date  . Hyperlipidemia   . Hypertension     There are no active problems to display for this patient.   Past Surgical History:  Procedure Laterality Date  . APPENDECTOMY          Home Medications    Prior to Admission medications   Medication Sig Start Date End Date Taking? Authorizing Provider  amLODipine-benazepril (LOTREL) 10-40 MG capsule  02/14/19   [provider]  bisoprolol-hydrochlorothiazide  (ZIAC) 2.5-6.25 MG per tablet Take 1 tablet by mouth daily.    [provider]  fish oil-omega-3 fatty acids 1000 MG capsule Take 2 g by mouth 2 (two) times daily.    [provider]    Family History Family History  Problem Relation Age of Onset  . Diabetes Maternal Grandmother     Social History Social History   Tobacco Use  . Smoking status: Never Smoker  . Smokeless tobacco: Never Used  Substance Use Topics  . Alcohol use: Yes    Comment: 2 gl of wine x2 days/week + 5-6 beers over weekend  . Drug use: Not on file     Allergies   Patient has no known allergies.   Review of Systems Review of Systems  Constitutional: Negative for chills and fever.  HENT: Negative for congestion and facial swelling.   Eyes: Negative for discharge and visual disturbance.  Respiratory: Negative for shortness of breath.   Cardiovascular: Positive for chest pain. Negative for palpitations.  Gastrointestinal: Negative for abdominal pain, diarrhea and vomiting.  Musculoskeletal: Negative for arthralgias and myalgias.  Skin: Negative for color change and rash.  Neurological: Negative for tremors, syncope and headaches.  Psychiatric/Behavioral: Negative for confusion and dysphoric mood.     Physical Exam Updated Vital Signs BP (!) 185/95 (BP Location: Right Arm)   Pulse (!) 51   Temp 97.7  F (36.5 C) (Oral)   Resp 16   Ht 5\' 7"  (1.702 m)   Wt 77.6 kg   SpO2 100%   BMI 26.78 kg/m   Physical Exam Vitals signs and nursing note reviewed.  Constitutional:      Appearance: He is well-developed.  HENT:     Head: Normocephalic and atraumatic.  Eyes:     Pupils: Pupils are equal, round, and reactive to light.  Neck:     Musculoskeletal: Normal range of motion and neck supple.     Vascular: No JVD.  Cardiovascular:     Rate and Rhythm: Normal rate and regular rhythm.     Heart sounds: No murmur. No friction rub. No gallop.   Pulmonary:     Effort: No respiratory  distress.     Breath sounds: No wheezing.  Chest:     Chest wall: Tenderness present.     Comments: Pain to the anterior axillary line of the left arm.  Reproduces the patient's pain.  Pulse motor and sensation are intact in the left upper extremity. Abdominal:     General: There is no distension.     Tenderness: There is no guarding or rebound.  Musculoskeletal: Normal range of motion.  Skin:    Coloration: Skin is not pale.     Findings: No rash.  Neurological:     Mental Status: He is alert and oriented to person, place, and time.  Psychiatric:        Behavior: Behavior normal.      ED Treatments / Results  Labs (all labs ordered are listed, but only abnormal results are displayed) Labs Reviewed  BASIC METABOLIC PANEL - Abnormal; Notable for the following components:      Result Value   Glucose, Bld 110 (*)    All other components within normal limits  CBC  TROPONIN I (HIGH SENSITIVITY)  TROPONIN I (HIGH SENSITIVITY)    EKG EKG Interpretation  Date/Time:  Tuesday March 13 2019 15:12:50 EDT Ventricular Rate:  45 PR Interval:    QRS Duration: 125 QT Interval:  475 QTC Calculation: 411 R Axis:   -41 Text Interpretation:  Sinus bradycardia RBBB and LAFB No significant change since last tracing Confirmed by Deno Etienne [phone]) on 03/13/2019 3:15:52 PM   Radiology Dg Chest 2 View  Result Date: 03/13/2019 CLINICAL DATA:  Left chest pain for a few days, worse today. EXAM: CHEST - 2 VIEW COMPARISON:  PA and lateral chest 11/25/2013. FINDINGS: Heart size is upper normal. Lungs clear. No pneumothorax or pleural effusion. No acute or focal bony abnormality. IMPRESSION: No acute disease. Electronically Signed   By: Inge Rise M.D.   On: 03/13/2019 16:06    Procedures Procedures (including critical care time)  Medications Ordered in ED Medications - No data to display   Initial Impression / Assessment and Plan / ED Course  I have reviewed the triage vital signs  and the nursing notes.  Pertinent labs & imaging results that were available during my care of the patient were reviewed by me and considered in my medical decision making (see chart for details).        48 yo M with a chief complaint of chest pain.  This is atypical in nature and reproduced on exam.  Actually improved with exertion.  Seems unlikely to be an MI of PE or DVT.  Pain is not changed significantly in 6 hours.  Will obtain a troponin chest x-ray lab work.  Chest x-ray  viewed by me without focal infiltrate or pneumothorax.  Troponin is negative.  No anemia or leukocytosis.  Discharge patient home.  PCP follow-up.  7:54 PM:  I have discussed the diagnosis/risks/treatment options with the patient and believe the pt to be eligible for discharge home to follow-up with PCP. We also discussed returning to the ED immediately if new or worsening sx occur. We discussed the sx which are most concerning (e.g., sudden worsening pain, fever, inability to tolerate by mouth) that necessitate immediate return. Medications administered to the patient during their visit and any new prescriptions provided to the patient are listed below.  Medications given during this visit Medications - No data to display   The patient appears reasonably screen and/or stabilized for discharge and I doubt any other medical condition or other [facility] requiring further screening, evaluation, or treatment in the ED at this time prior to discharge.    Final Clinical Impressions(s) / ED Diagnoses   Final diagnoses:  Atypical chest pain    ED Discharge Orders    None       Melene PlanFloyd, Jazmyne Beauchesne, DO 03/13/19 1954

## 2019-03-13 NOTE — ED Notes (Signed)
Patient transported to X-ray 

## 2019-03-13 NOTE — ED Triage Notes (Signed)
Left sided intermittent chest pain radiating to left ribs for a few days, worse today.  Pt also states his left arm started to tingle today.  No sob.  No diaphoresis, no nausea.  Some dizziness.  Pt states he felt like his BP may have been up and pt states he feels anxious.

## 2019-03-13 NOTE — Discharge Instructions (Signed)
Take 4 over the counter ibuprofen tablets 3 times a day or 2 over-the-counter naproxen tablets twice a day for pain. Also take tylenol 1000mg(2 extra strength) four times a day.    

## 2019-03-15 DIAGNOSIS — Z0000 Encounter for general adult medical examination without abnormal findings: Secondary | ICD-10-CM | POA: Diagnosis not present

## 2019-03-15 DIAGNOSIS — E7800 Pure hypercholesterolemia, unspecified: Secondary | ICD-10-CM | POA: Diagnosis not present

## 2019-03-15 DIAGNOSIS — I10 Essential (primary) hypertension: Secondary | ICD-10-CM | POA: Diagnosis not present

## 2019-03-15 DIAGNOSIS — Z125 Encounter for screening for malignant neoplasm of prostate: Secondary | ICD-10-CM | POA: Diagnosis not present

## 2019-03-15 DIAGNOSIS — Z23 Encounter for immunization: Secondary | ICD-10-CM | POA: Diagnosis not present

## 2019-03-15 MED FILL — HYDROCHLOROTHIAZIDE 12.5 MG: 12.5 | 90 days supply | Qty: 90 | Fill #0

## 2019-03-15 MED FILL — AMLODIPINE-BENAZ 10-40 MG: 10-40 | 90 days supply | Qty: 90 | Fill #0

## 2019-03-15 MED FILL — LOVASTATIN 20 MG TABS: 20 | 90 days supply | Qty: 90 | Fill #0

## 2019-03-22 MED FILL — BISOPROLOL-HCTZ 10-6.25 MG: 10-6.25 | 90 days supply | Qty: 90 | Fill #0

## 2019-06-12 MED FILL — AMLODIPINE-BENAZ 10-40 MG: 10-40 | 90 days supply | Qty: 90 | Fill #1

## 2019-06-12 MED FILL — LOVASTATIN 20 MG TABS: 20 | 90 days supply | Qty: 90 | Fill #1

## 2019-06-12 MED FILL — BISOPROLOL-HCTZ 10-6.25 MG: 10-6.25 | 90 days supply | Qty: 90 | Fill #1

## 2019-09-10 MED FILL — BISOPROLOL-HCTZ 10-6.25 MG: 10-6.25 | 90 days supply | Qty: 90 | Fill #0

## 2019-09-10 MED FILL — AMLODIPINE-BENAZ 10-40 MG: 10-40 | 90 days supply | Qty: 90 | Fill #0

## 2019-09-10 MED FILL — LOVASTATIN 20 MG TABS: 20 | 90 days supply | Qty: 90 | Fill #0

## 2019-12-04 MED FILL — AMLODIPINE-BENAZ 10-40 MG: 10-40 | 90 days supply | Qty: 90 | Fill #0

## 2019-12-04 MED FILL — BISOPROLOL-HCTZ 10-6.25 MG: 10-6.25 | 90 days supply | Qty: 90 | Fill #0

## 2019-12-05 MED FILL — LOVASTATIN 20 MG TABS: 20 | 90 days supply | Qty: 90 | Fill #0

## 2020-03-10 MED FILL — LOVASTATIN 20 MG TABS: 20 | 90 days supply | Qty: 90 | Fill #1

## 2020-03-13 MED FILL — BISOPROLOL-HCTZ 10-6.25 MG: 10-6.25 | 90 days supply | Qty: 90 | Fill #1

## 2020-03-13 MED FILL — AMLODIPINE-BENAZ 10-40 MG: 10-40 | 90 days supply | Qty: 90 | Fill #1

## 2020-03-25 DIAGNOSIS — Z23 Encounter for immunization: Secondary | ICD-10-CM | POA: Diagnosis not present

## 2020-03-25 DIAGNOSIS — Z0000 Encounter for general adult medical examination without abnormal findings: Secondary | ICD-10-CM | POA: Diagnosis not present

## 2020-03-25 DIAGNOSIS — Z125 Encounter for screening for malignant neoplasm of prostate: Secondary | ICD-10-CM | POA: Diagnosis not present

## 2020-03-25 DIAGNOSIS — I10 Essential (primary) hypertension: Secondary | ICD-10-CM | POA: Diagnosis not present

## 2020-03-25 DIAGNOSIS — E7800 Pure hypercholesterolemia, unspecified: Secondary | ICD-10-CM | POA: Diagnosis not present

## 2020-04-29 ENCOUNTER — Encounter: Payer: Self-pay | Admitting: Sports Medicine

## 2020-04-29 ENCOUNTER — Ambulatory Visit (INDEPENDENT_AMBULATORY_CARE_PROVIDER_SITE_OTHER): Payer: 59

## 2020-04-29 ENCOUNTER — Ambulatory Visit (INDEPENDENT_AMBULATORY_CARE_PROVIDER_SITE_OTHER): Payer: 59 | Admitting: Sports Medicine

## 2020-04-29 ENCOUNTER — Other Ambulatory Visit: Payer: Self-pay

## 2020-04-29 ENCOUNTER — Other Ambulatory Visit: Payer: Self-pay | Admitting: Sports Medicine

## 2020-04-29 ENCOUNTER — Ambulatory Visit: Payer: Self-pay

## 2020-04-29 DIAGNOSIS — M7989 Other specified soft tissue disorders: Secondary | ICD-10-CM | POA: Diagnosis not present

## 2020-04-29 DIAGNOSIS — M7042 Prepatellar bursitis, left knee: Secondary | ICD-10-CM | POA: Diagnosis not present

## 2020-04-29 MED ORDER — DOXYCYCLINE HYCLATE 100 MG PO TABS: 100.0000 mg | ORAL_TABLET | Freq: Two times a day (BID) | ORAL | 0 refills | Status: DC

## 2020-04-29 MED ORDER — HYDROCODONE-ACETAMINOPHEN 5-325 MG PO TABS: 1.0000 | ORAL_TABLET | Freq: Three times a day (TID) | ORAL | 0 refills | Status: DC | PRN

## 2020-04-29 MED FILL — HYDROCODON-APAP 5-325: 5-325 | 5 days supply | Qty: 15 | Fill #0

## 2020-04-29 MED FILL — DOXYCYCLINE HYCLATE 100 MG: 100 | 7 days supply | Qty: 14 | Fill #0

## 2020-04-29 NOTE — Assessment & Plan Note (Addendum)
This is a pleasant 49 year old male, he is one of our Biomedical scientist. Unfortunately he recently fell directly onto his left knee and had some swelling. It is a little bit warm and profoundly swollen, minimally fluctuant. Clinically he has a prepatellar bursitis/hematoma, initially we tried to aspirate but were unable to get any blood out, we have decided to do a hematoma evacuation, extensive hematoma removed.   Adding hydrocodone for postprocedural pain, he will continue the doxycycline, return to see me in 10 days for suture removal.

## 2020-04-29 NOTE — Progress Notes (Signed)
    Procedures performed today:    Hematoma evacuation Risks, benefits, and alternatives explained and consent obtained. Time out conducted. Surface cleaned with alcohol. 5cc lidocaine with epinephine infiltrated anterior prepatellar bursa Adequate anesthesia ensured. Area prepped and draped in a sterile fashion. #11 blade used to make a stab incision into hematoma. Hematoma expressed with pressure. Curved hemostat used to explore 4 quadrants and loculations broken up. Further hematoma expressed. Small stab incision with a horizontal mattress 3-0 Ethilon stitch Pt stable. Aftercare and follow-up advised.  The knee was then strapped with a compressive dressing.  Independent interpretation of notes and tests performed by another provider:   None.  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 It is a little bit warm and profoundly swollen, minimally fluctuant. Clinically he has a prepatellar bursitis/hematoma, initially we tried to aspirate but were unable to get any blood out, we have decided to do a hematoma evacuation, extensive hematoma removed.   Adding hydrocodone for postprocedural pain, he will continue the doxycycline, return to see me in 10 days for suture removal.    ___________________________________________ Ihor Austin. Benjamin Stain, M.D., ABFM., CAQSM. Primary Care and Sports Medicine [facility]  Adjunct Instructor of Family Medicine  [employer]

## 2020-05-09 ENCOUNTER — Other Ambulatory Visit: Payer: Self-pay

## 2020-05-09 ENCOUNTER — Ambulatory Visit (INDEPENDENT_AMBULATORY_CARE_PROVIDER_SITE_OTHER): Payer: 59 | Admitting: Sports Medicine

## 2020-05-09 ENCOUNTER — Ambulatory Visit (INDEPENDENT_AMBULATORY_CARE_PROVIDER_SITE_OTHER): Payer: 59

## 2020-05-09 DIAGNOSIS — M7042 Prepatellar bursitis, left knee: Secondary | ICD-10-CM

## 2020-05-09 DIAGNOSIS — M79675 Pain in left toe(s): Secondary | ICD-10-CM | POA: Diagnosis not present

## 2020-05-09 DIAGNOSIS — H52223 Regular astigmatism, bilateral: Secondary | ICD-10-CM | POA: Diagnosis not present

## 2020-05-09 DIAGNOSIS — S99922A Unspecified injury of left foot, initial encounter: Secondary | ICD-10-CM

## 2020-05-09 DIAGNOSIS — M79672 Pain in left foot: Secondary | ICD-10-CM | POA: Diagnosis not present

## 2020-05-09 NOTE — Assessment & Plan Note (Signed)
At the time of his fall needle also injured his left foot, pain is localized directly over the third MTP. He does have some significant bruising and minimal tenderness to palpation, not much swelling. I would like some x-rays of his third toe, if we do see a fracture we will probably buddy tape the toes if the fracture is in one of the phalanges or add a postop shoe if it is in the metatarsal.

## 2020-05-09 NOTE — Assessment & Plan Note (Signed)
Alex Jensen returns, we did a surgical hematoma evacuation at the last visit, he has done extremely well, I removed his sutures today, he did develop somewhat of a postoperative seroma as I warned him, this was aspirated today with ultrasound guidance. I strap the knee with compressive dressing, return to see me in 2 weeks, he does understand that we may need to perform serial aspirations for now.

## 2020-05-09 NOTE — Progress Notes (Signed)
    Procedures performed today:    Procedure: Real-time Ultrasound Guided  aspiration of left prepatellar bursal seroma Device: Samsung HS60  Verbal informed consent obtained.  Time-out conducted.  Noted no overlying erythema, induration, or other signs of local infection.  Skin prepped in a sterile fashion.  Local anesthesia: Topical Ethyl chloride.  With sterile technique and under real time ultrasound guidance: Noted seroma in the prepatellar bursa, using an 18-gauge needle aspirated approximately 25 mL of serosanguineous fluid.   Completed without difficulty  Pain immediately resolved suggesting accurate placement of the medication.  Advised to call if fevers/chills, erythema, induration, drainage, or persistent bleeding.  Images permanently stored and available for review in PACS.  Impression: Technically successful ultrasound guided aspiration.  The knee was then strapped with a compressive dressing.  Independent interpretation of notes and tests performed by another provider:   None.  Brief History, Exam, Impression, and Recommendations:    Prepatellar bursitis, left knee Lloyd Huger returns, we did a surgical hematoma evacuation at the last visit, he has done extremely well, I removed his sutures today, he did develop somewhat of a postoperative seroma as I warned him, this was aspirated today with ultrasound guidance. I strap the knee with compressive dressing, return to see me in 2 weeks, he does understand that we may need to perform serial aspirations for now.  Injury of foot, left At the time of his fall needle also injured his left foot, pain is localized directly over the third MTP. He does have some significant bruising and minimal tenderness to palpation, not much swelling. I would like some x-rays of his third toe, if we do see a fracture we will probably buddy tape the toes if the fracture is in one of the phalanges or add a postop shoe if it is in the  metatarsal.    ___________________________________________ Ihor Austin. Benjamin Stain, M.D., ABFM., CAQSM. Primary Care and Sports Medicine [facility]  Adjunct Instructor of Family Medicine  [employer]

## 2020-06-10 ENCOUNTER — Other Ambulatory Visit (HOSPITAL_COMMUNITY): Payer: Self-pay | Admitting: Family Medicine

## 2020-06-10 MED FILL — AMLODIPINE-BENAZ 10-40 MG: 10-40 | 90 days supply | Qty: 90 | Fill #0

## 2020-06-10 MED FILL — BISOPROLOL-HCTZ 10-6.25 MG: 10-6.25 | 90 days supply | Qty: 90 | Fill #0

## 2020-06-10 MED FILL — LOVASTATIN 40 MG TABS: 40 | 90 days supply | Qty: 90 | Fill #0

## 2020-08-05 IMAGING — DX DG CHEST 2V
2 series · 2 of 2 positions shown · non-contrast
Comparison: PA and lateral chest 11/25/2013.

CLINICAL DATA: Left chest pain for a few days, worse today.

EXAM:
CHEST - 2 VIEW

[chest pa]
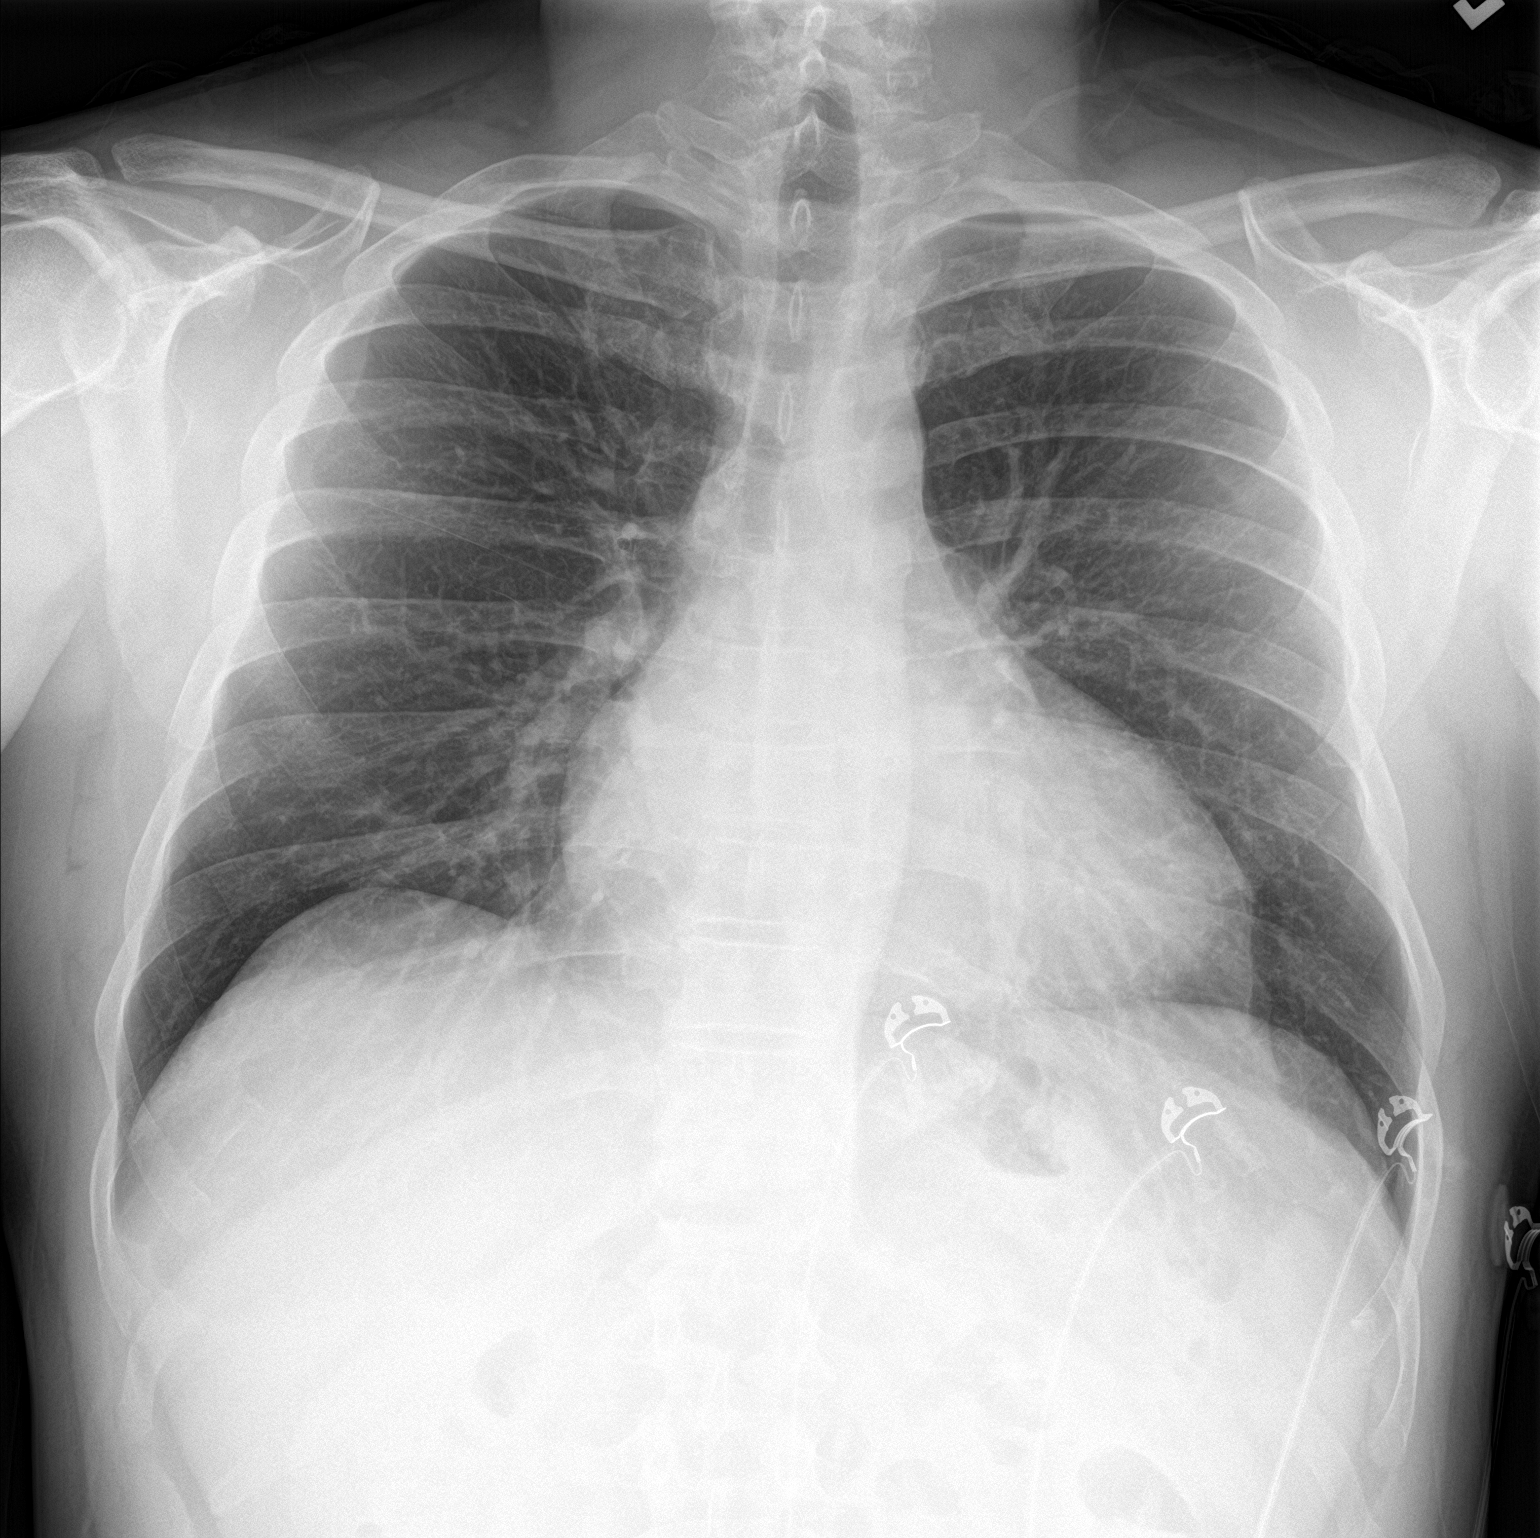

[chest lat]
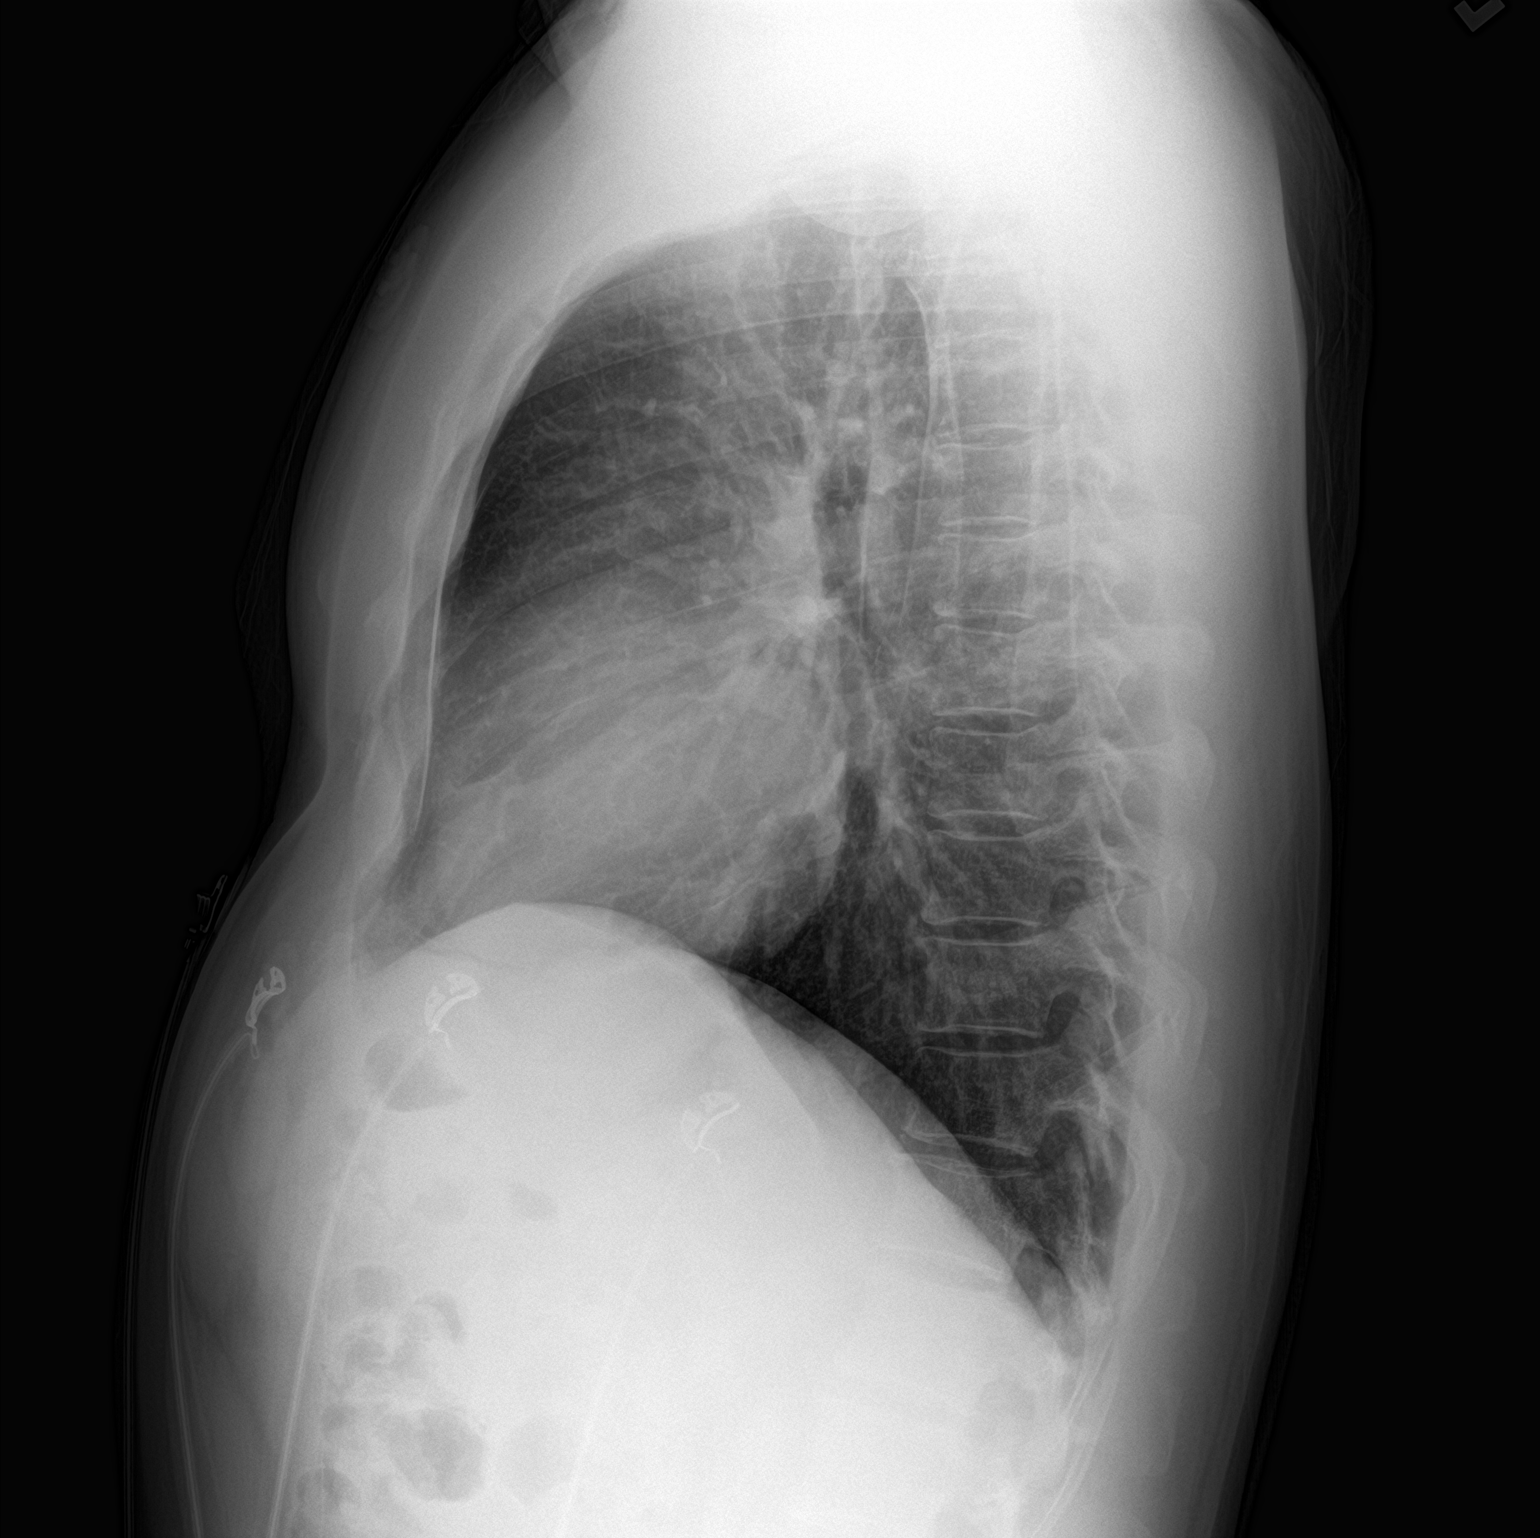

[2 of 2 positions shown; findings below may reference images not displayed]

FINDINGS: Heart size is upper normal. Lungs clear. No pneumothorax or pleural
effusion. No acute or focal bony abnormality.
IMPRESSION: No acute disease.

## 2020-09-11 MED FILL — LOVASTATIN 40 MG TABS: 40 | 90 days supply | Qty: 90 | Fill #1

## 2020-09-11 MED FILL — BISOPROLOL-HCTZ 10-6.25 MG: 10-6.25 | 90 days supply | Qty: 90 | Fill #1

## 2020-09-11 MED FILL — AMLODIPINE-BENAZ 10-40 MG: 10-40 | 90 days supply | Qty: 90 | Fill #1

## 2020-12-11 ENCOUNTER — Other Ambulatory Visit (HOSPITAL_COMMUNITY): Payer: Self-pay

## 2020-12-11 MED ORDER — AMLODIPINE BESY-BENAZEPRIL HCL 10-40 MG PO CAPS
1.0000 | ORAL_CAPSULE | Freq: Every day | ORAL | 0 refills | Status: DC
  Filled 2020-12-11: qty 90, 90d supply, fill #0

## 2020-12-11 MED ORDER — LOVASTATIN 40 MG PO TABS
40.0000 mg | ORAL_TABLET | Freq: Every day | ORAL | 0 refills | Status: DC
  Filled 2020-12-11: qty 90, 90d supply, fill #0

## 2020-12-11 MED ORDER — BISOPROLOL-HYDROCHLOROTHIAZIDE 10-6.25 MG PO TABS
1.0000 | ORAL_TABLET | Freq: Every day | ORAL | 0 refills | Status: DC
  Filled 2020-12-11: qty 90, 90d supply, fill #0

## 2021-02-19 DIAGNOSIS — H9011 Conductive hearing loss, unilateral, right ear, with unrestricted hearing on the contralateral side: Secondary | ICD-10-CM | POA: Diagnosis not present

## 2021-02-19 DIAGNOSIS — H6122 Impacted cerumen, left ear: Secondary | ICD-10-CM | POA: Diagnosis not present

## 2021-02-19 DIAGNOSIS — H73891 Other specified disorders of tympanic membrane, right ear: Secondary | ICD-10-CM | POA: Diagnosis not present

## 2021-02-19 DIAGNOSIS — H7411 Adhesive right middle ear disease: Secondary | ICD-10-CM | POA: Diagnosis not present

## 2021-03-11 ENCOUNTER — Other Ambulatory Visit (HOSPITAL_COMMUNITY): Payer: Self-pay

## 2021-03-11 MED ORDER — LOVASTATIN 40 MG PO TABS
40.0000 mg | ORAL_TABLET | Freq: Every day | ORAL | 0 refills | Status: DC
  Filled 2021-03-11: qty 90, 90d supply, fill #0

## 2021-03-11 MED ORDER — AMLODIPINE BESY-BENAZEPRIL HCL 10-40 MG PO CAPS
1.0000 | ORAL_CAPSULE | Freq: Every day | ORAL | 0 refills | Status: DC
  Filled 2021-03-11: qty 90, 90d supply, fill #0

## 2021-03-11 MED ORDER — BISOPROLOL-HYDROCHLOROTHIAZIDE 10-6.25 MG PO TABS
1.0000 | ORAL_TABLET | Freq: Every day | ORAL | 0 refills | Status: DC
  Filled 2021-03-11: qty 90, 90d supply, fill #0

## 2021-03-27 ENCOUNTER — Other Ambulatory Visit (HOSPITAL_COMMUNITY): Payer: Self-pay

## 2021-03-27 DIAGNOSIS — Z125 Encounter for screening for malignant neoplasm of prostate: Secondary | ICD-10-CM | POA: Diagnosis not present

## 2021-03-27 DIAGNOSIS — E7800 Pure hypercholesterolemia, unspecified: Secondary | ICD-10-CM | POA: Diagnosis not present

## 2021-03-27 DIAGNOSIS — Z23 Encounter for immunization: Secondary | ICD-10-CM | POA: Diagnosis not present

## 2021-03-27 DIAGNOSIS — Z0000 Encounter for general adult medical examination without abnormal findings: Secondary | ICD-10-CM | POA: Diagnosis not present

## 2021-03-27 DIAGNOSIS — I10 Essential (primary) hypertension: Secondary | ICD-10-CM | POA: Diagnosis not present

## 2021-03-27 DIAGNOSIS — F419 Anxiety disorder, unspecified: Secondary | ICD-10-CM | POA: Diagnosis not present

## 2021-03-27 MED ORDER — LOVASTATIN 40 MG PO TABS
40.0000 mg | ORAL_TABLET | Freq: Every day | ORAL | 1 refills | Status: AC
  Filled 2021-03-27 – 2021-06-01 (×2): qty 90, 90d supply, fill #0
  Filled 2021-06-02: qty 30, 30d supply, fill #0
  Filled 2021-07-08: qty 30, 30d supply, fill #1
  Filled 2021-08-07: qty 30, 30d supply, fill #2

## 2021-03-27 MED ORDER — AMLODIPINE BESY-BENAZEPRIL HCL 10-40 MG PO CAPS
1.0000 | ORAL_CAPSULE | Freq: Every day | ORAL | 1 refills | Status: AC
  Filled 2021-03-27: qty 90, 90d supply, fill #0
  Filled 2021-06-01 – 2021-06-02 (×2): qty 30, 30d supply, fill #0
  Filled 2021-07-08: qty 30, 30d supply, fill #1
  Filled 2021-08-07: qty 30, 30d supply, fill #2

## 2021-03-27 MED ORDER — BISOPROLOL-HYDROCHLOROTHIAZIDE 10-6.25 MG PO TABS
1.0000 | ORAL_TABLET | Freq: Every day | ORAL | 1 refills | Status: AC
  Filled 2021-03-27 – 2021-06-01 (×2): qty 90, 90d supply, fill #0
  Filled 2021-06-02: qty 30, 30d supply, fill #0
  Filled 2021-07-08: qty 30, 30d supply, fill #1
  Filled 2021-08-07: qty 30, 30d supply, fill #2

## 2021-03-27 MED ORDER — SERTRALINE HCL 50 MG PO TABS
50.0000 mg | ORAL_TABLET | Freq: Every day | ORAL | 1 refills | Status: AC
  Filled 2021-03-27: qty 30, 30d supply, fill #0

## 2021-06-01 ENCOUNTER — Other Ambulatory Visit (HOSPITAL_COMMUNITY): Payer: Self-pay

## 2021-06-02 ENCOUNTER — Other Ambulatory Visit (HOSPITAL_COMMUNITY): Payer: Self-pay

## 2021-06-03 ENCOUNTER — Other Ambulatory Visit (HOSPITAL_COMMUNITY): Payer: Self-pay

## 2021-07-08 ENCOUNTER — Other Ambulatory Visit (HOSPITAL_COMMUNITY): Payer: Self-pay

## 2021-08-07 ENCOUNTER — Other Ambulatory Visit (HOSPITAL_COMMUNITY): Payer: Self-pay

## 2022-01-25 ENCOUNTER — Other Ambulatory Visit (HOSPITAL_COMMUNITY): Payer: Self-pay

## 2022-01-25 MED ORDER — PEG 3350-KCL-NA BICARB-NACL 420 G PO SOLR
ORAL | 0 refills | Status: AC
  Filled 2022-01-25: qty 4000, 1d supply, fill #0

## 2022-01-25 MED ORDER — HYDROCORTISONE ACETATE 25 MG RE SUPP
RECTAL | 1 refills | Status: AC
  Filled 2022-01-25: qty 10, 10d supply, fill #0

## 2022-04-26 ENCOUNTER — Other Ambulatory Visit (HOSPITAL_BASED_OUTPATIENT_CLINIC_OR_DEPARTMENT_OTHER): Payer: Self-pay

## 2022-04-26 MED ORDER — COVID-19 MRNA 2023-2024 VACCINE (COMIRNATY) 0.3 ML INJECTION
INTRAMUSCULAR | 0 refills | Status: AC
  Filled 2022-04-26: qty 0.3, 1d supply, fill #0

## 2022-04-26 MED ORDER — INFLUENZA VAC SPLIT QUAD 0.5 ML IM SUSY
PREFILLED_SYRINGE | INTRAMUSCULAR | 0 refills | Status: AC
  Filled 2022-04-26: qty 0.5, 1d supply, fill #0

## 2022-10-14 ENCOUNTER — Emergency Department (HOSPITAL_BASED_OUTPATIENT_CLINIC_OR_DEPARTMENT_OTHER)
Admission: EM | Admit: 2022-10-14 | Discharge: 2022-10-14 | Disposition: A | Discharge status: Home / Self Care | Payer: Self-pay | Attending: Emergency Medicine | Admitting: Emergency Medicine

## 2022-10-14 ENCOUNTER — Encounter (HOSPITAL_BASED_OUTPATIENT_CLINIC_OR_DEPARTMENT_OTHER): Payer: Self-pay | Admitting: Emergency Medicine

## 2022-10-14 ENCOUNTER — Other Ambulatory Visit (HOSPITAL_BASED_OUTPATIENT_CLINIC_OR_DEPARTMENT_OTHER): Payer: Self-pay

## 2022-10-14 ENCOUNTER — Emergency Department (HOSPITAL_BASED_OUTPATIENT_CLINIC_OR_DEPARTMENT_OTHER): Payer: Commercial Managed Care - PPO

## 2022-10-14 ENCOUNTER — Other Ambulatory Visit: Payer: Self-pay

## 2022-10-14 DIAGNOSIS — K5732 Diverticulitis of large intestine without perforation or abscess without bleeding: Secondary | ICD-10-CM | POA: Insufficient documentation

## 2022-10-14 DIAGNOSIS — K5792 Diverticulitis of intestine, part unspecified, without perforation or abscess without bleeding: Secondary | ICD-10-CM

## 2022-10-14 LAB — CBC
HCT: 41.6 % (ref 39.0–52.0)
Hemoglobin: 14.9 g/dL (ref 13.0–17.0)
MCH: 32.1 pg (ref 26.0–34.0)
MCHC: 35.8 g/dL (ref 30.0–36.0)
MCV: 89.7 fL (ref 80.0–100.0)
Platelets: 189 10*3/uL (ref 150–400)
RBC: 4.64 MIL/uL (ref 4.22–5.81)
RDW: 12 % (ref 11.5–15.5)
WBC: 11 10*3/uL — ABNORMAL HIGH (ref 4.0–10.5)
nRBC: 0 % (ref 0.0–0.2)

## 2022-10-14 LAB — URINALYSIS, ROUTINE W REFLEX MICROSCOPIC
Bacteria, UA: NONE SEEN
Bilirubin Urine: NEGATIVE
Glucose, UA: NEGATIVE mg/dL
Hgb urine dipstick: NEGATIVE
Ketones, ur: NEGATIVE mg/dL
Leukocytes,Ua: NEGATIVE
Nitrite: NEGATIVE
Protein, ur: 30 mg/dL — AB
Specific Gravity, Urine: 1.025 (ref 1.005–1.030)
pH: 6.5 (ref 5.0–8.0)

## 2022-10-14 LAB — COMPREHENSIVE METABOLIC PANEL
ALT: 21 U/L (ref 0–44)
AST: 19 U/L (ref 15–41)
Albumin: 5 g/dL (ref 3.5–5.0)
Alkaline Phosphatase: 43 U/L (ref 38–126)
Anion gap: 10 (ref 5–15)
BUN: 13 mg/dL (ref 6–20)
CO2: 22 mmol/L (ref 22–32)
Calcium: 10.3 mg/dL (ref 8.9–10.3)
Chloride: 103 mmol/L (ref 98–111)
Creatinine, Ser: 1.08 mg/dL (ref 0.61–1.24)
GFR, Estimated: >60 mL/min (ref >60)
Glucose, Bld: 157 mg/dL — ABNORMAL HIGH (ref 70–99)
Potassium: 3.9 mmol/L (ref 3.5–5.1)
Sodium: 135 mmol/L (ref 135–145)
Total Bilirubin: 1.8 mg/dL — ABNORMAL HIGH (ref 0.3–1.2)
Total Protein: 8.1 g/dL (ref 6.5–8.1)

## 2022-10-14 LAB — LIPASE, BLOOD: Lipase: 13 U/L (ref 11–51)

## 2022-10-14 MED ORDER — AMOXICILLIN-POT CLAVULANATE 875-125 MG PO TABS
1.0000 | ORAL_TABLET | Freq: Two times a day (BID) | ORAL | 0 refills | Status: AC
  Filled 2022-10-14: qty 20, 10d supply, fill #0

## 2022-10-14 MED ORDER — HYDROCODONE-ACETAMINOPHEN 5-325 MG PO TABS
1.0000 | ORAL_TABLET | ORAL | 0 refills | Status: AC | PRN
  Filled 2022-10-14: qty 10, 2d supply, fill #0

## 2022-10-14 MED ORDER — IOHEXOL 300 MG/ML  SOLN
100.0000 mL | Freq: Once | INTRAMUSCULAR | Status: AC | PRN
  Administered 2022-10-14: 80 mL via INTRAVENOUS

## 2022-10-14 MED ORDER — ONDANSETRON HCL 4 MG/2ML IJ SOLN
4.0000 mg | Freq: Once | INTRAMUSCULAR | Status: AC
  Administered 2022-10-14: 4 mg via INTRAVENOUS
  Filled 2022-10-14: qty 2

## 2022-10-14 MED ORDER — MORPHINE SULFATE (PF) 4 MG/ML IV SOLN
4.0000 mg | Freq: Once | INTRAVENOUS | Status: AC
  Administered 2022-10-14: 4 mg via INTRAVENOUS
  Filled 2022-10-14: qty 1

## 2022-10-14 MED ORDER — FENTANYL CITRATE PF 50 MCG/ML IJ SOSY
50.0000 ug | PREFILLED_SYRINGE | Freq: Once | INTRAMUSCULAR | Status: AC
  Administered 2022-10-14: 50 ug via INTRAVENOUS
  Filled 2022-10-14: qty 1

## 2022-10-14 NOTE — ED Provider Notes (Signed)
[facility] Provider Note   CSN: [id number] Arrival date & time: 10/14/22  1139     History  Chief Complaint  Patient presents with   Abdominal Pain    Alex Jensen. is a 52 y.o. male.  52 y.o male with a PMH of Appendectomy presents to the ED with a chief complaint of lower abdominal pain which began yesterday evening.  He describes a sharp stabbing pain to the right lower quadrant, described similarly as when he had his appendix removed.  Exacerbated with palpation, along with certain positions.  No alleviation in symptoms despite Tylenol along with turning in positions.  Reports a normal bowel movement yesterday morning.  Last oral intake last night around 7 PM.  He does endorse chills however has not been running any fevers at home.  He did have a colonoscopy last year, found to have some polyps removed along with some diverticulosis present, it is unsure whether this is causing his symptoms.  No nausea, no vomiting, no other complaints reported.  The history is provided by the patient.  Abdominal Pain Associated symptoms: no chills and no fever        Home Medications Prior to Admission medications   Medication Sig Start Date End Date Taking? Authorizing Provider  amoxicillin-clavulanate (AUGMENTIN) 875-125 MG tablet Take 1 tablet by mouth every 12 (twelve) hours for 10 days. 10/14/22 10/24/22 Yes Majestic Brister, Beverley Fiedler, PA-C  HYDROcodone-acetaminophen (NORCO/VICODIN) 5-325 MG tablet Take 1 tablet by mouth every 4 (four) hours as needed for up to 3 days. 10/14/22 10/17/22 Yes Ilija Maxim, Beverley Fiedler, PA-C  amLODipine-benazepril (LOTREL) 10-40 MG capsule  02/14/19   [provider]  amLODipine-benazepril (LOTREL) 10-40 MG capsule TAKE 1 CAPSULE BY MOUTH ONCE A DAY 06/10/20 06/10/21  Orpah Melter, MD  amLODipine-benazepril (LOTREL) 10-40 MG capsule Take 1 capsule by mouth daily. 03/27/21     bisoprolol-hydrochlorothiazide (ZIAC) 10-6.25 MG tablet  TAKE 1 TALBET BY MOUTH ONCE A DAY 06/10/20 06/10/21  Orpah Melter, MD  bisoprolol-hydrochlorothiazide [facility]) 10-6.25 MG tablet Take 1 tablet by mouth daily. 03/27/21     bisoprolol-hydrochlorothiazide (ZIAC) 2.5-6.25 MG per tablet Take 1 tablet by mouth daily.    [provider]  COVID-19 mRNA vaccine [phone] (COMIRNATY) SUSP injection Inject into the muscle. 04/26/22   Carlyle Basques, MD  fish oil-omega-3 fatty acids 1000 MG capsule Take 2 g by mouth 2 (two) times daily.    [provider]  hydrocortisone (ANUSOL-HC) 25 MG suppository Unwrap & insert 1 suppository rectally once a day for 10 days 01/25/22     influenza vac split quadrivalent PF (FLUARIX) 0.5 ML injection Inject into the muscle. 04/26/22   Carlyle Basques, MD  lovastatin (MEVACOR) 40 MG tablet TAKE 1 TABLET BY MOUTH WITH A MEAL ONCE A DAY 06/10/20 06/10/21  Orpah Melter, MD  lovastatin (MEVACOR) 40 MG tablet Take 1 tablet (40 mg total) by mouth daily. 03/27/21     polyethylene glycol-electrolytes (NULYTELY) 420 g solution Use as directed. 01/25/22     sertraline (ZOLOFT) 50 MG tablet Take 1 tablet (50 mg total) by mouth daily. 03/27/21         Allergies    Patient has no known allergies.    Review of Systems   Review of Systems  Constitutional:  Negative for chills and fever.  Gastrointestinal:  Positive for abdominal pain.  All other systems reviewed and are negative.   Physical Exam Updated Vital Signs BP (!) 171/95 (BP Location: Right  Arm)   Pulse 62   Temp 98.5 F (36.9 C)   Resp 18   SpO2 99%  Physical Exam Vitals and nursing note reviewed.  HENT:     Head: Normocephalic and atraumatic.  Cardiovascular:     Rate and Rhythm: Normal rate.  Pulmonary:     Effort: Pulmonary effort is normal.     Breath sounds: No wheezing.  Abdominal:     General: Abdomen is flat.     Palpations: Abdomen is soft.     Tenderness: There is abdominal tenderness in the right lower quadrant and suprapubic  area. There is guarding and rebound. There is no right CVA tenderness or left CVA tenderness.  Skin:    General: Skin is warm and dry.  Neurological:     Mental Status: He is alert and oriented to person, place, and time.     ED Results / Procedures / Treatments   Labs (all labs ordered are listed, but only abnormal results are displayed) Labs Reviewed  URINALYSIS, ROUTINE W REFLEX MICROSCOPIC - Abnormal; Notable for the following components:      Result Value   Protein, ur 30 (*)    All other components within normal limits  COMPREHENSIVE METABOLIC PANEL - Abnormal; Notable for the following components:   Glucose, Bld 157 (*)    Total Bilirubin 1.8 (*)    All other components within normal limits  CBC - Abnormal; Notable for the following components:   WBC 11.0 (*)    All other components within normal limits  LIPASE, BLOOD    EKG None  Radiology CT ABDOMEN PELVIS W CONTRAST  Result Date: 10/14/2022 CLINICAL DATA:  Abdominal pain EXAM: CT ABDOMEN AND PELVIS WITH CONTRAST TECHNIQUE: Multidetector CT imaging of the abdomen and pelvis was performed using the standard protocol following bolus administration of intravenous contrast. RADIATION DOSE REDUCTION: This exam was performed according to the departmental dose-optimization program which includes automated exposure control, adjustment of the mA and/or kV according to patient size and/or use of iterative reconstruction technique. CONTRAST:  66mL OMNIPAQUE IOHEXOL 300 MG/ML  SOLN COMPARISON:  None Available. FINDINGS: Lower chest: No acute abnormality. Hepatobiliary: No focal liver abnormality is seen. No gallstones, gallbladder wall thickening, or biliary dilatation. Pancreas: Spleen: Normal in size without focal abnormality. Adrenals/Urinary Tract: Adrenal glands are unremarkable. Kidneys are normal, without renal calculi, focal lesion, or hydronephrosis. Bladder is unremarkable. Stomach/Bowel: Unremarkable stomach. No bowel  dilatation to suggest obstruction. Diverticula sigmoid. Sigmoid diverticulitis with sigmoid mesenteric inflammatory changes and mucosal thickening. No abscess. Vascular/Lymphatic: No significant vascular findings are present. No enlarged abdominal or pelvic lymph nodes. Reproductive: Prostate is prominent. Other: No abdominal wall hernia or abnormality. No abdominopelvic ascites. Musculoskeletal: No acute or significant osseous findings. Lumbosacral degenerative changes. IMPRESSION: Sigmoid diverticulitis.  No abscess or obstruction. Electronically Signed   By: Sammie Bench M.D.   On: 10/14/2022 14:30    Procedures Procedures    Medications Ordered in ED Medications  morphine (PF) 4 MG/ML injection 4 mg (has no administration in time range)  ondansetron (ZOFRAN) injection 4 mg (has no administration in time range)  fentaNYL (SUBLIMAZE) injection 50 mcg (50 mcg Intravenous Given 10/14/22 1245)  iohexol (OMNIPAQUE) 300 MG/ML solution 100 mL (80 mLs Intravenous Contrast Given 10/14/22 1409)    ED Course/ Medical Decision Making/ A&P                             Medical  Decision Making Amount and/or Complexity of Data Reviewed Labs: ordered. Radiology: ordered.  Risk Prescription drug management.   This patient presents to the ED for concern of abdominal pain, this involves a number of treatment options, and is a complaint that carries with it a high risk of complications and morbidity.  The differential diagnosis includes diverticulitis, obstruction, versus viral illness.   Co morbidities: Discussed in HPI   Brief History:  Patient with a prior appendectomy here with lower abdominal pain sharp shooting sensation to the right lower quadrant exacerbated with movement along with palpation.  No nausea, no vomiting.  Normal bowel movement yesterday.  EMR reviewed including pt PMHx, past surgical history and past visits to ER.   See HPI for more details   Lab Tests:  I ordered and  independently interpreted labs.  The pertinent results include:    I personally reviewed all laboratory work and imaging. Metabolic panel without any acute abnormality specifically kidney function within normal limits and no significant electrolyte abnormalities. CBC without leukocytosis or significant anemia. UA no signs of infection. Lipase level is normal.    Imaging Studies:  NAD. I personally reviewed all imaging studies and no acute abnormality found. I agree with radiology interpretation. CT Abdomen and pelvis: Sigmoid diverticulitis.  No abscess or obstruction.   Medicines ordered:  I ordered medication including fentanyl  for pain control Reevaluation of the patient after these medicines showed that the patient improved I have reviewed the patients home medicines and have made adjustments as needed  Reevaluation:  After the interventions noted above I re-evaluated patient and found that they have :improved   Social Determinants of Health:  The patient's social determinants of health were a factor in the care of this patient   Problem List / ED Course:  Patient presents the ED with a chief complaint of lower abdominal pain has been ongoing since yesterday, described as [place] sharp stabbing pain to the right lower quadrant, does have a prior history of an appendectomy.  Also endorsing some anorexia, with his last oral intake being around 7 PM yesterday.  No improvement despite Tylenol, rest.  Blood work is unremarkable unremarkable aside from some slight leukocytosis of 11, CMP with no electrolyte derangement, the rest of his labs are within normal limits.  CT abdomen there is guarding on the right lower quadrant indicated for acute diverticulitis, we discussed treatment with Augmentin for home, will also give him a short course of Norco to help with pain control, we discussed medication can cause constipation therefore will need to MiraLAX for the next couple days.  Patient is  agreeable of plan and treatment.  Patient is hemodynamically stable for discharge.  Dispostion:  After consideration of the diagnostic results and the patients response to treatment, I feel that the patent would benefit from treatment with antibiotics.    Portions of this note were generated with [employer]. Dictation errors may occur despite best attempts at proofreading.   Final Clinical Impression(s) / ED Diagnoses Final diagnoses:  Diverticulitis    Rx / DC Orders ED Discharge Orders          Ordered    amoxicillin-clavulanate (AUGMENTIN) 875-125 MG tablet  Every 12 hours        10/14/22 1447    HYDROcodone-acetaminophen (NORCO/VICODIN) 5-325 MG tablet  Every 4 hours PRN        10/14/22 1453              Kelson Queenan, [place], PA-C  10/14/22 1455    Melene Plan, DO 10/15/22 [phone]

## 2022-10-14 NOTE — ED Notes (Signed)
Discharge paperwork given and verbally understood. 

## 2022-10-14 NOTE — Discharge Instructions (Addendum)
You were given a prescription for antibiotics and or to help treat your diverticulitis, please take 1 tablet twice a day for the next 10 days.  In addition, you were given Norco to help with pain control, please take 1 tablet every 4 hours for pain for the next 3 days.

## 2022-10-14 NOTE — ED Triage Notes (Signed)
Pt arrives pov,steady gait with c/o RLQ and lower ABD pain that started on LLQ last night. Endorses nausea, subjective fever, denies dysuria

## 2022-10-14 NOTE — ED Notes (Signed)
Pt understood no drinking/driving/operating heavy equipment due to meds that were given.Marland KitchenMarland Kitchen

## 2022-10-27 ENCOUNTER — Other Ambulatory Visit (HOSPITAL_COMMUNITY): Payer: Self-pay

## 2022-10-27 MED ORDER — BISOPROLOL-HYDROCHLOROTHIAZIDE 5-6.25 MG PO TABS
1.0000 | ORAL_TABLET | Freq: Two times a day (BID) | ORAL | 1 refills | Status: DC
  Filled 2022-10-27: qty 60, 30d supply, fill #0

## 2022-10-28 ENCOUNTER — Other Ambulatory Visit: Payer: Self-pay

## 2022-10-29 ENCOUNTER — Other Ambulatory Visit: Payer: Self-pay

## 2023-07-28 ENCOUNTER — Other Ambulatory Visit: Payer: Self-pay | Admitting: Gastroenterology

## 2023-07-28 ENCOUNTER — Encounter: Payer: Self-pay | Admitting: Gastroenterology

## 2023-07-28 DIAGNOSIS — K5792 Diverticulitis of intestine, part unspecified, without perforation or abscess without bleeding: Secondary | ICD-10-CM

## 2023-07-29 ENCOUNTER — Ambulatory Visit
Admission: RE | Admit: 2023-07-29 | Discharge: 2023-07-29 | Disposition: A | Discharge status: Home / Self Care | Payer: 59 | Source: Ambulatory Visit | Attending: Gastroenterology

## 2023-07-29 ENCOUNTER — Other Ambulatory Visit: Payer: Self-pay | Admitting: Gastroenterology

## 2023-07-29 DIAGNOSIS — K5792 Diverticulitis of intestine, part unspecified, without perforation or abscess without bleeding: Secondary | ICD-10-CM

## 2023-07-29 MED ORDER — IOPAMIDOL (ISOVUE-300) INJECTION 61%
100.0000 mL | Freq: Once | INTRAVENOUS | Status: AC | PRN
  Administered 2023-07-29: 100 mL via INTRAVENOUS
